# Patient Record
Sex: Female | Born: 1935 | Race: White | Hispanic: No | Smoking: Never smoker
Health system: Southern US, Community
[De-identification: ages and names within clinical notes are randomized; demographics above are authoritative.]

## PROBLEM LIST (undated history)

## (undated) DIAGNOSIS — H9190 Unspecified hearing loss, unspecified ear: Secondary | ICD-10-CM

## (undated) DIAGNOSIS — I10 Essential (primary) hypertension: Secondary | ICD-10-CM

## (undated) HISTORY — PX: JOINT REPLACEMENT: SHX530

---

## 2019-05-26 ENCOUNTER — Other Ambulatory Visit: Payer: Self-pay

## 2019-05-26 ENCOUNTER — Emergency Department (HOSPITAL_BASED_OUTPATIENT_CLINIC_OR_DEPARTMENT_OTHER): Payer: Medicare Other

## 2019-05-26 ENCOUNTER — Encounter (HOSPITAL_BASED_OUTPATIENT_CLINIC_OR_DEPARTMENT_OTHER): Payer: Self-pay | Admitting: *Deleted

## 2019-05-26 ENCOUNTER — Emergency Department (HOSPITAL_BASED_OUTPATIENT_CLINIC_OR_DEPARTMENT_OTHER)
Admission: EM | Admit: 2019-05-26 | Discharge: 2019-05-26 | Disposition: A | Discharge status: Home / Self Care | Payer: Medicare Other | Attending: Emergency Medicine | Admitting: Emergency Medicine

## 2019-05-26 DIAGNOSIS — W01198A Fall on same level from slipping, tripping and stumbling with subsequent striking against other object, initial encounter: Secondary | ICD-10-CM | POA: Insufficient documentation

## 2019-05-26 DIAGNOSIS — Y9389 Activity, other specified: Secondary | ICD-10-CM | POA: Diagnosis not present

## 2019-05-26 DIAGNOSIS — S51812A Laceration without foreign body of left forearm, initial encounter: Secondary | ICD-10-CM | POA: Diagnosis not present

## 2019-05-26 DIAGNOSIS — S0990XA Unspecified injury of head, initial encounter: Secondary | ICD-10-CM | POA: Insufficient documentation

## 2019-05-26 DIAGNOSIS — Y998 Other external cause status: Secondary | ICD-10-CM | POA: Diagnosis not present

## 2019-05-26 DIAGNOSIS — Y929 Unspecified place or not applicable: Secondary | ICD-10-CM | POA: Insufficient documentation

## 2019-05-26 DIAGNOSIS — I10 Essential (primary) hypertension: Secondary | ICD-10-CM | POA: Diagnosis not present

## 2019-05-26 DIAGNOSIS — S59912A Unspecified injury of left forearm, initial encounter: Secondary | ICD-10-CM | POA: Diagnosis present

## 2019-05-26 HISTORY — DX: Essential (primary) hypertension: I10

## 2019-05-26 NOTE — ED Notes (Signed)
Steri strips applied to left elbow skin tear, non stick top dsg applied and kling

## 2019-05-26 NOTE — ED Provider Notes (Signed)
[facility] EMERGENCY DEPARTMENT Provider Note   CSN: [id number] Arrival date & time: 05/26/19  1854     History Chief Complaint  Patient presents with  . Fall    Caitlyn Braun is a 86 y.o. female.  86 yo F with a chief complaints of a fall.  Patient states that she was reaching for something and lost her balance.  Ended up striking the side of her head on a counter and then her arm on the same counter.  She denies other area of injury denies headache denies neck pain denies chest pain denies abdominal pain.  Has been able to ambulate afterwards without issue.  The history is provided by the patient and a relative.  Fall This is a recurrent problem. The current episode started 3 to 5 hours ago. The problem occurs rarely. The problem has been resolved. Pertinent negatives include no chest pain, no headaches and no shortness of breath. Nothing aggravates the symptoms. Nothing relieves the symptoms.       Past Medical History:  Diagnosis Date  . Hypertension     There are no problems to display for this patient.   Past Surgical History:  Procedure Laterality Date  . JOINT REPLACEMENT       OB History   No obstetric history on file.     History reviewed. No pertinent family history.  Social History   Tobacco Use  . Smoking status: Never Smoker  . Smokeless tobacco: Never Used  Substance Use Topics  . Alcohol use: Never  . Drug use: Never    Home Medications Prior to Admission medications   Medication Sig Start Date End Date Taking? Authorizing Provider  lisinopril (ZESTRIL) 40 MG tablet Take by mouth.    [provider]    Allergies    Patient has no known allergies.  Review of Systems   Review of Systems  Constitutional: Negative for chills and fever.  HENT: Negative for congestion and rhinorrhea.   Eyes: Negative for redness and visual disturbance.  Respiratory: Negative for shortness of breath and wheezing.   Cardiovascular:  Negative for chest pain and palpitations.  Gastrointestinal: Negative for nausea and vomiting.  Genitourinary: Negative for dysuria and urgency.  Musculoskeletal: Negative for arthralgias and myalgias.  Skin: Positive for wound. Negative for pallor.  Neurological: Negative for dizziness and headaches.    Physical Exam Updated Vital Signs BP (!) 200/84 (BP Location: Right Arm)   Pulse 72   Temp 98.2 F (36.8 C) (Oral)   Resp 18   Ht 5' 3.5" (1.613 m)   Wt 56.7 kg   SpO2 98%   BMI 21.80 kg/m   Physical Exam Vitals and nursing note reviewed.  Constitutional:      General: She is not in acute distress.    Appearance: She is well-developed. She is not diaphoretic.  HENT:     Head: Normocephalic and atraumatic.  Eyes:     Pupils: Pupils are equal, round, and reactive to light.  Cardiovascular:     Rate and Rhythm: Normal rate and regular rhythm.     Heart sounds: No murmur. No friction rub. No gallop.   Pulmonary:     Effort: Pulmonary effort is normal.     Breath sounds: No wheezing or rales.  Abdominal:     General: There is no distension.     Palpations: Abdomen is soft.     Tenderness: There is no abdominal tenderness.  Musculoskeletal:  General: No tenderness.     Cervical back: Normal range of motion and neck supple.     Comments: Palpated from head to toe without obvious noted areas of pain.  Skin:    General: Skin is warm and dry.     Comments: Approximately 5 cm skin tear to the left lateral forearm.  No surrounding bony tenderness.  Neurological:     Mental Status: She is alert and oriented to person, place, and time.  Psychiatric:        Behavior: Behavior normal.     ED Results / Procedures / Treatments   Labs (all labs ordered are listed, but only abnormal results are displayed) Labs Reviewed - No data to display  EKG None  Radiology CT HEAD WO CONTRAST  Result Date: 05/26/2019 CLINICAL DATA:  Witnessed fall, hit left side of head EXAM: CT  HEAD WITHOUT CONTRAST TECHNIQUE: Contiguous axial images were obtained from the base of the skull through the vertex without intravenous contrast. COMPARISON:  None. FINDINGS: Brain: No acute infarct or hemorrhage. Lateral ventricles and midline structures are unremarkable. Scattered hypodensities within the periventricular white matter consistent with chronic small vessel ischemic changes. No acute extra-axial fluid collections. No mass effect. Vascular: No hyperdense vessel or unexpected calcification. Skull: Normal. Negative for fracture or focal lesion. Sinuses/Orbits: No acute finding. Other: None. IMPRESSION: 1. No acute intracranial process. Electronically Signed   By: Sharlet Salina M.D.   On: 05/26/2019 19:59   CT Cervical Spine Wo Contrast  Result Date: 05/26/2019 CLINICAL DATA:  Head trauma EXAM: CT CERVICAL SPINE WITHOUT CONTRAST TECHNIQUE: Multidetector CT imaging of the cervical spine was performed without intravenous contrast. Multiplanar CT image reconstructions were also generated. COMPARISON:  None FINDINGS: Alignment: Straightening of the cervical spine. Trace anterolisthesis C3 on C4 and C4 on C5. Facet alignment is maintained. Skull base and vertebrae: No acute fracture. No primary bone lesion or focal pathologic process. Soft tissues and spinal canal: No prevertebral fluid or swelling. No visible canal hematoma. Disc levels: Multiple level degenerative change, most advanced and moderate at C5-C6. Facet degenerative change at multiple levels. Upper chest: Suspected prior right carotid intervention. Lung apices are clear Other: None IMPRESSION: Straightening with trace anterior listhesis C3 on C4 and C4 on C5. No fracture is seen Electronically Signed   By: Jasmine Pang M.D.   On: 05/26/2019 20:03    Procedures Procedures (including critical care time)  Medications Ordered in ED Medications - No data to display  ED Course  I have reviewed the triage vital signs and the nursing  notes.  Pertinent labs & imaging results that were available during my care of the patient were reviewed by me and considered in my medical decision making (see chart for details).    MDM Rules/Calculators/A&P                      86 yo F with a chief complaints of a fall.  Nonsyncopal by history.  Complaining only of a skin tear and that her head brushed up against a counter.  Will obtain a CT of the head and C-spine.  Repaired the wound at bedside.  Discharged home.  CT scan negative.  8:34 PM:  I have discussed the diagnosis/risks/treatment options with the patient and family and believe the pt to be eligible for discharge home to follow-up with PCP. We also discussed returning to the ED immediately if new or worsening sx occur. We discussed the sx which  are most concerning (e.g., sudden worsening pain, fever, inability to tolerate by mouth) that necessitate immediate return. Medications administered to the patient during their visit and any new prescriptions provided to the patient are listed below.  Medications given during this visit Medications - No data to display   The patient appears reasonably screen and/or stabilized for discharge and I doubt any other medical condition or other [facility] requiring further screening, evaluation, or treatment in the ED at this time prior to discharge.   Final Clinical Impression(s) / ED Diagnoses Final diagnoses:  Skin tear of forearm without complication, left, initial encounter    Rx / DC Orders ED Discharge Orders    None       Deno Etienne, DO 05/26/19 2034

## 2019-05-26 NOTE — ED Notes (Signed)
Patient transported to X-rayPatient transported to CT

## 2019-05-26 NOTE — ED Triage Notes (Signed)
Pt from [facility]. States that she reached and had a witnessed fall. Pt did hit left side of head, no bruising, swelling noted. Pt denies LOC.  Skin tear with bandage to left forearm. Pt A/O x 4.

## 2019-09-02 ENCOUNTER — Emergency Department (HOSPITAL_COMMUNITY): Payer: Medicare Other

## 2019-09-02 ENCOUNTER — Emergency Department (HOSPITAL_COMMUNITY)
Admission: EM | Admit: 2019-09-02 | Discharge: 2019-09-02 | Disposition: A | Discharge status: Other Acute Inpatient Hospital | Payer: Medicare Other | Attending: Emergency Medicine | Admitting: Emergency Medicine

## 2019-09-02 DIAGNOSIS — Z20822 Contact with and (suspected) exposure to covid-19: Secondary | ICD-10-CM | POA: Insufficient documentation

## 2019-09-02 DIAGNOSIS — R531 Weakness: Secondary | ICD-10-CM

## 2019-09-02 DIAGNOSIS — Z79899 Other long term (current) drug therapy: Secondary | ICD-10-CM | POA: Insufficient documentation

## 2019-09-02 DIAGNOSIS — J069 Acute upper respiratory infection, unspecified: Secondary | ICD-10-CM

## 2019-09-02 DIAGNOSIS — J029 Acute pharyngitis, unspecified: Secondary | ICD-10-CM | POA: Insufficient documentation

## 2019-09-02 DIAGNOSIS — I10 Essential (primary) hypertension: Secondary | ICD-10-CM | POA: Insufficient documentation

## 2019-09-02 DIAGNOSIS — R52 Pain, unspecified: Secondary | ICD-10-CM

## 2019-09-02 LAB — CBC WITH DIFFERENTIAL/PLATELET
Abs Immature Granulocytes: 0.12 10*3/uL — ABNORMAL HIGH (ref 0.00–0.07)
Basophils Absolute: 0 10*3/uL (ref 0.0–0.1)
Basophils Relative: 0 %
Eosinophils Absolute: 0.1 10*3/uL (ref 0.0–0.5)
Eosinophils Relative: 0 %
HCT: 39.1 % (ref 36.0–46.0)
Hemoglobin: 12.3 g/dL (ref 12.0–15.0)
Immature Granulocytes: 1 %
Lymphocytes Relative: 10 %
Lymphs Abs: 1.5 10*3/uL (ref 0.7–4.0)
MCH: 29.2 pg (ref 26.0–34.0)
MCHC: 31.5 g/dL (ref 30.0–36.0)
MCV: 92.9 fL (ref 80.0–100.0)
Monocytes Absolute: 0.9 10*3/uL (ref 0.1–1.0)
Monocytes Relative: 6 %
Neutro Abs: 12.1 10*3/uL — ABNORMAL HIGH (ref 1.7–7.7)
Neutrophils Relative %: 83 %
Platelets: 451 10*3/uL — ABNORMAL HIGH (ref 150–400)
RBC: 4.21 MIL/uL (ref 3.87–5.11)
RDW: 15.3 % (ref 11.5–15.5)
WBC: 14.7 10*3/uL — ABNORMAL HIGH (ref 4.0–10.5)
nRBC: 0 % (ref 0.0–0.2)

## 2019-09-02 LAB — BASIC METABOLIC PANEL
Anion gap: 13 (ref 5–15)
BUN: 27 mg/dL — ABNORMAL HIGH (ref 8–23)
CO2: 23 mmol/L (ref 22–32)
Calcium: 9.4 mg/dL (ref 8.9–10.3)
Chloride: 102 mmol/L (ref 98–111)
Creatinine, Ser: 0.99 mg/dL (ref 0.44–1.00)
GFR calc Af Amer: 56 mL/min — ABNORMAL LOW (ref >60)
GFR calc non Af Amer: 48 mL/min — ABNORMAL LOW (ref >60)
Glucose, Bld: 141 mg/dL — ABNORMAL HIGH (ref 70–99)
Potassium: 4.3 mmol/L (ref 3.5–5.1)
Sodium: 138 mmol/L (ref 135–145)

## 2019-09-02 LAB — URINALYSIS, ROUTINE W REFLEX MICROSCOPIC
Bacteria, UA: NONE SEEN
Bilirubin Urine: NEGATIVE
Glucose, UA: NEGATIVE mg/dL
Ketones, ur: NEGATIVE mg/dL
Leukocytes,Ua: NEGATIVE
Nitrite: NEGATIVE
Protein, ur: >=300 mg/dL — AB
Specific Gravity, Urine: 1.026 (ref 1.005–1.030)
pH: 5 (ref 5.0–8.0)

## 2019-09-02 LAB — CBG MONITORING, ED: Glucose-Capillary: 144 mg/dL — ABNORMAL HIGH (ref 70–99)

## 2019-09-02 LAB — GROUP A STREP BY PCR: Group A Strep by PCR: NOT DETECTED

## 2019-09-02 LAB — TROPONIN I (HIGH SENSITIVITY)
Troponin I (High Sensitivity): 34 ng/L — ABNORMAL HIGH (ref <18)
Troponin I (High Sensitivity): 19 ng/L — ABNORMAL HIGH (ref <18)
Troponin I (High Sensitivity): 44 ng/L — ABNORMAL HIGH (ref <18)

## 2019-09-02 LAB — SARS CORONAVIRUS 2 BY RT PCR (HOSPITAL ORDER, PERFORMED IN ~~LOC~~ HOSPITAL LAB): SARS Coronavirus 2: NEGATIVE

## 2019-09-02 MED ORDER — AZITHROMYCIN 250 MG PO TABS
ORAL_TABLET | ORAL | 0 refills | Status: DC
Start: 2019-09-02 — End: 2021-05-24

## 2019-09-02 MED ORDER — AZITHROMYCIN 250 MG PO TABS
ORAL_TABLET | ORAL | 0 refills | Status: DC
Start: 2019-09-02 — End: 2019-09-02

## 2019-09-02 MED ORDER — LISINOPRIL 20 MG PO TABS
40.0000 mg | ORAL_TABLET | Freq: Once | ORAL | Status: AC
  Administered 2019-09-02: 40 mg via ORAL

## 2019-09-02 NOTE — Evaluation (Signed)
Physical Therapy Evaluation Patient Details Name: Caitlyn Braun MRN: [id number] DOB: 01-Jan-1923 Today's Date: 09/02/2019   History of Present Illness  86 y.o. presentng from home with weakness and fatigue found leaning over on the toilet. Pt complains of sore throat, also has chronic RLE pain.  Clinical Impression  Pt presents to PT with significant weakness as well as deficits in functional mobility, gait, balance, power, endurance, safety awareness, and cognition. Pt requires max-totalA for all functional mobility due to weakness. Pt has preference to lean onto left elbow with sitting activity and is unable to obtain an upright position with verbal cues or PT physical assistance. PT defers standing or transfer attempts due to safety risk as pt begins to ignore PT cues for technique. Pt will benefit from continued acute PT POC to improve mobility quality and to reduce falls risk. PT recommends SNF placement at the time of discharge as the pt is at a great risk of falling and needs physical assistance to perform all mobility tasks at this time.    Follow Up Recommendations SNF;Supervision/Assistance - 24 hour (assistance for all functional mobility)    Equipment Recommendations  Wheelchair (measurements PT);Wheelchair cushion (measurements PT);Hospital bed (mechanical lift)    Recommendations for Other Services       Precautions / Restrictions Precautions Precautions: Fall Restrictions Weight Bearing Restrictions: No      Mobility  Bed Mobility Overal bed mobility: Needs Assistance Bed Mobility: Supine to Sit;Sit to Supine     Supine to sit: Max assist Sit to supine: Total assist      Transfers Overall transfer level: Needs assistance               General transfer comment: attempted to initiate stand however pt unable to come to upright sitting position. Pt with poor initiation through UE to pull with PT assist into standing. Due to difficulty following PT commands  for technique PT defers stand attempt due to high falls risk  Ambulation/Gait                Stairs            Wheelchair Mobility    Modified Rankin (Stroke Patients Only)       Balance Overall balance assessment: Needs assistance Sitting-balance support: Feet unsupported;Single extremity supported Sitting balance-Leahy Scale: Poor Sitting balance - Comments: modA with significant left lateral trunk lean onto left elbow. Pt is unable to sit upright during session despite PT assistance, always returning to left elbow for support Postural control: Left lateral lean                                   Pertinent Vitals/Pain Pain Assessment: Faces Faces Pain Scale: Hurts little more Pain Location: R knee with mobility Pain Descriptors / Indicators: Grimacing Pain Intervention(s): Monitored during session    Home Living Family/patient expects to be discharged to:: Assisted living               Home Equipment: Walker - 2 wheels      Prior Function Level of Independence: Independent with assistive device(s);Needs assistance   Gait / Transfers Assistance Needed: pt reports ambulating modI with use of Rollator  ADL's / Homemaking Assistance Needed: staff assist with IADLs, performs ADLs independently        Hand Dominance        Extremity/Trunk Assessment   Upper Extremity Assessment Upper Extremity Assessment: Generalized weakness  Lower Extremity Assessment Lower Extremity Assessment: Generalized weakness;RLE deficits/detail RLE Deficits / Details: RLE with more significant weakness, 3-/5 knee extension, 3/5 PF/DF and hip flexion    Cervical / Trunk Assessment Cervical / Trunk Assessment: Kyphotic;Other exceptions (pt appears to have scoliosis with a right curvature)  Communication   Communication: No difficulties  Cognition Arousal/Alertness: Awake/alert Behavior During Therapy: WFL for tasks assessed/performed Overall  Cognitive Status: No family/caregiver present to determine baseline cognitive functioning                                 General Comments: pt is oriented to person, place, situation. Pt with awareness of weakness but does not seem to understand how this will impair her from returning to walking at her ALF although she was unable to stand with PT at this time.      General Comments General comments (skin integrity, edema, etc.): VSS on RA    Exercises     Assessment/Plan    PT Assessment Patient needs continued PT services  PT Problem List Decreased strength;Decreased range of motion;Decreased activity tolerance;Decreased balance;Decreased mobility;Decreased cognition;Decreased knowledge of use of DME;Decreased safety awareness;Decreased knowledge of precautions;Pain       PT Treatment Interventions DME instruction;Gait training;Functional mobility training;Therapeutic activities;Therapeutic exercise;Balance training;Neuromuscular re-education;Cognitive remediation;Patient/family education;Wheelchair mobility training    PT Goals (Current goals can be found in the Care Plan section)  Acute Rehab PT Goals Patient Stated Goal: To go home PT Goal Formulation: With patient Time For Goal Achievement: 09/16/19 Potential to Achieve Goals: Fair    Frequency Min 2X/week   Barriers to discharge Decreased caregiver support      Co-evaluation               AM-PAC PT "6 Clicks" Mobility  Outcome Measure Help needed turning from your back to your side while in a flat bed without using bedrails?: Total Help needed moving from lying on your back to sitting on the side of a flat bed without using bedrails?: Total Help needed moving to and from a bed to a chair (including a wheelchair)?: Total Help needed standing up from a chair using your arms (e.g., wheelchair or bedside chair)?: Total Help needed to walk in hospital room?: Total Help needed climbing 3-5 steps with a  railing? : Total 6 Click Score: 6    End of Session   Activity Tolerance: Patient limited by fatigue Patient left: in bed Nurse Communication: Mobility status;Need for lift equipment PT Visit Diagnosis: Other abnormalities of gait and mobility (R26.89);Muscle weakness (generalized) (M62.81);Difficulty in walking, not elsewhere classified (R26.2)    Time: 6712-4580 PT Time Calculation (min) (ACUTE ONLY): 36 min   Charges:   PT Evaluation $PT Eval Moderate Complexity: 1 Mod          Arlyss Gandy, PT, DPT Acute Rehabilitation Pager: [phone]   Arlyss Gandy 09/02/2019, 4:07 PM

## 2019-09-02 NOTE — Consult Note (Signed)
Date: 09/02/2019               Patient Name:  Caitlyn Braun MRN: [id number]  DOB: 1922/08/23 Age / Sex: 86 y.o., female   PCP: Patient, No Pcp Per         Requesting Physician: Dr. Myrtis Ser Stevphen Meuse, MD    Consulting Reason:  Weakness     Chief Complaint: Weakness  History of Present Illness: Caitlyn Braun is a 86 yo woman with a PMH of HTN who presented to the ED by [facility] from [facility] for an evaluation of weakness. Per EMS, patient was found slumped over in her bathroom at the facility. She was found on the toilet during an incontinent episode and having difficulty standing. The staff called EMS because they were worried about a possible stroke. The patient LKW was 2000 and she is usually ambulatory with a walker.  Patient was evaluated at the bedside. She initially had no recollection of why she was in the ED. But when probed some more, she states she had difficult getting up from the toilet. She usually ambulates with a walker and she takes longer (~30 min sometimes) to get up when she sits down. She denies any recent falls or trauma. States she has had a a chronic right leg pain (points to R thigh) that has been limiting her mobility. She usually does exercises while in her bed to increase her strength. She endorse a recent cold with associated sore throat and productive cough. The phlegm is like "gravy" and "yellow". She has tried Stage manager for it. She endorse some arthritis pains in her fingers but denies any fever, chills, nasal drainage, CP, SOB, abdominal pain, back pain, dizziness, headache, numbness, tingling, leg swelling, N/V, dysuria, diarrhea or constipation.    Meds: No current facility-administered medications for this encounter.   Current Outpatient Medications  Medication Sig Dispense Refill  . guaiFENesin (MUCINEX) 600 MG 12 hr tablet Take 600 mg by mouth 2 (two) times daily.    Marland Kitchen lisinopril (ZESTRIL) 40 MG tablet Take 40 mg by mouth daily.      Marland Kitchen azithromycin (ZITHROMAX) 250 MG tablet Take 2 tablets on the first day, followed by 1 tablet the following 4 days. 6 tablet 0    Allergies: Allergies as of 09/02/2019  . (No Known Allergies)   Past Medical History:  Diagnosis Date  . Hypertension    Past Surgical History:  Procedure Laterality Date  . JOINT REPLACEMENT     No family history on file. Social History   Socioeconomic History  . Marital status: Widowed    Spouse name: Not on file  . Number of children: Not on file  . Years of education: Not on file  . Highest education level: Not on file  Occupational History  . Not on file  Tobacco Use  . Smoking status: Never Smoker  . Smokeless tobacco: Never Used  Substance and Sexual Activity  . Alcohol use: Never  . Drug use: Never  . Sexual activity: Not on file  Other Topics Concern  . Not on file  Social History Narrative  . Not on file   Social Determinants of Health   Financial Resource Strain:   . Difficulty of Paying Living Expenses:   Food Insecurity:   . Worried About Programme researcher, broadcasting/film/video in the Last Year:   . Barista in the Last Year:   Transportation Needs:   . Freight forwarder (Medical):   Marland Kitchen  Lack of Transportation (Non-Medical):   Physical Activity:   . Days of Exercise per Week:   . Minutes of Exercise per Session:   Stress:   . Feeling of Stress :   Social Connections:   . Frequency of Communication with Friends and Family:   . Frequency of Social Gatherings with Friends and Family:   . Attends Religious Services:   . Active Member of Clubs or Organizations:   . Attends Banker Meetings:   Marland Kitchen Marital Status:   Intimate Partner Violence:   . Fear of Current or Ex-Partner:   . Emotionally Abused:   Marland Kitchen Physically Abused:   . Sexually Abused:     Review of Systems: Pertinent items noted in HPI and remainder of comprehensive ROS otherwise negative.  Physical Exam: Blood pressure (!) 186/77, pulse 90,  temperature 97.8 F (36.6 C), temperature source Oral, resp. rate 18, height 5\' 3"  (1.6 m), weight 56.7 kg, SpO2 97 %.  General: Pleasant elderly woman laying in bed with hearing difficulties. No acute distress. Head: Normocephalic/Atraumatic HEENT: MMM. PERRLA. Tongue midline. Neck: Supple. Normal ROM. CV: RRR. No m/r/g. No LE edema Respiratory: Lungs CTAB. No wheezes. No increased WOB.  Abdomen: Soft, non-tender, non-distended. Normal bowel sounds. Skin: No rashes or lesions. Multiple small cherry hemangiomas on abdomen. Decreased skin turgor. Extremities: Normal ROM. Mild tenderness to palpation of the R thigh. Evidence of R knee effusion 2/2 to arthritic changes. No signs of trauma. Neuro: Oriented to person and place but not to time. Moves all extremities. No facial droop or slurred speech. Strength 5/5 RUE, LUE and LLE. 4/5 on LLE 2/2 to pain. Normal sensation  Lab results: CBC Latest Ref Rng & Units 09/02/2019  WBC 4.0 - 10.5 K/uL 14.7(H)  Hemoglobin 12.0 - 15.0 g/dL 09/04/2019  Hematocrit 36 - 46 % 39.1  Platelets 150 - 400 K/uL 451(H)   BMP Latest Ref Rng & Units 09/02/2019  Glucose 70 - 99 mg/dL 09/04/2019)  BUN 8 - 23 mg/dL 539(J)  Creatinine 67(H - 1.00 mg/dL 4.19  Sodium 3.79 - 024 mmol/L 138  Potassium 3.5 - 5.1 mmol/L 4.3  Chloride 98 - 111 mmol/L 102  CO2 22 - 32 mmol/L 23  Calcium 8.9 - 10.3 mg/dL 9.4   UA negative for leukocytes, WBC and nitrites. Positive for large hgb and proteins.   Imaging results:  DG Chest 2 View  Result Date: 09/02/2019 CLINICAL DATA:  Generalized weakness EXAM: CHEST - 2 VIEW COMPARISON:  None. FINDINGS: Cardiac shadow is within normal limits. Aortic calcifications are noted. The lungs are well aerated without focal infiltrate or sizable effusion. No acute bony abnormality is seen. IMPRESSION: No acute abnormality noted. Electronically Signed   By: 09/04/2019 M.D.   On: 09/02/2019 10:59   CT Head Wo Contrast  Result Date: 09/02/2019 CLINICAL  DATA:  Generalized weakness. EXAM: CT HEAD WITHOUT CONTRAST TECHNIQUE: Contiguous axial images were obtained from the base of the skull through the vertex without intravenous contrast. COMPARISON:  Head CT dated 05/26/2019. FINDINGS: Brain: No evidence of acute infarction, hemorrhage, hydrocephalus, extra-axial collection or mass lesion/mass effect. There is mild cerebral volume loss with associated ex vacuo dilatation. Periventricular white matter hypoattenuation likely represents chronic small vessel ischemic disease. Vascular: There are vascular calcifications in the carotid siphons. Skull: Normal. Negative for fracture or focal lesion. Sinuses/Orbits: There is sinus disease of the bilateral maxillary and ethmoid sinuses. Other: None. IMPRESSION: No acute intracranial process. Electronically Signed   By: 07/26/2019  [place] M.D.   On: 09/02/2019 10:34   DG Knee Complete 4 Views Right  Result Date: 09/02/2019 CLINICAL DATA:  Patient found slumped over in bathroom. EXAM: RIGHT KNEE - COMPLETE 4+ VIEW COMPARISON:  None. FINDINGS: Diffuse decreased bone mineralization. Mild-to-moderate tricompartmental osteoarthritic change. No acute fracture or dislocation. IMPRESSION: No acute findings. Electronically Signed   By: Elberta Fortis M.D.   On: 09/02/2019 14:14    Other results: EKG: Sinus rhythm with premature atrial complexes. No ST changes or evidence of ischemic changes. There are no previous tracings available for comparison.  Assessment, Plan, & Recommendations by Problem: Principal Problem:   URI (upper respiratory infection) Active Problems:   Weakness  1) Weakness Patient transferred to ED for stroke evaluation due to witnessed generalized weakness at a facility. Stroke work up negative w/ CT head showing no acute intracranial process. Neuro exam relatively normal with the only significant finding being 4/5 strength in RLE 2/2 pain. CXR and EKG unremarkable. UA negative for an infectious process but  shows mild dehydration. X-ray R knee does not show any fracture or dislocation. On assessment, patient likely weak due to mild dehydration and chronic right lower extremity pain. PT evaluated patient and recommended 24 hour supervision/assistance and wheelchair. Currently no indication for admission. Would benefit from rehab at her assisted living facility.   Recs: Patient will benefit from ongoing skilled PT services in her Assisted Living facility to continue to advance safe functional mobility, address ongoing impairments and minimize fall risk. Maintain hydration.   2) Upper Respiratory Infection Patient reported a recent cold w/ sore throat and a productive cough. Denies any headaches, fever, chills or nasal drainage. Treated with saltwater gargles. Afebrile on arrival but with WBC of 14.7. Negative for covid19 and group A strep. Likely an upper respiratory infection that is improving. Would recommend Azithromycin 250 mg  Recs: Azithromycin 250 mg, take 2 tablets first day, followed by 1 tablet for the next 4 days. Follow up with PCP  Signed: Steffanie Rainwater, MD 09/02/2019, 5:32 PM  [phone] Internal Medicine Teaching Service

## 2019-09-02 NOTE — ED Provider Notes (Addendum)
[facility] EMERGENCY DEPARTMENT Provider Note   CSN: [id number] Arrival date & time: 09/02/19  6629     History No chief complaint on file.   Caitlyn Braun is a 86 y.o. female.   Weakness Severity:  Moderate Onset quality:  Gradual Timing:  Constant Progression:  Unchanged Chronicity:  New Context comment:  Unclear, reported by facility, pt only complaint is sore throat Relieved by:  Nothing Worsened by:  Nothing Ineffective treatments:  None tried Associated symptoms: difficulty walking (reported)   Associated symptoms: no abdominal pain, no arthralgias, no chest pain, no cough, no diarrhea, no dysuria, no fever, no foul-smelling urine, no headaches, no loss of consciousness, no nausea, no sensory-motor deficit, no shortness of breath and no vomiting        Past Medical History:  Diagnosis Date  . Hypertension     There are no problems to display for this patient.   Past Surgical History:  Procedure Laterality Date  . JOINT REPLACEMENT       OB History   No obstetric history on file.     No family history on file.  Social History   Tobacco Use  . Smoking status: Never Smoker  . Smokeless tobacco: Never Used  Substance Use Topics  . Alcohol use: Never  . Drug use: Never    Home Medications Prior to Admission medications   Medication Sig Start Date End Date Taking? Authorizing Provider  guaiFENesin (MUCINEX) 600 MG 12 hr tablet Take 600 mg by mouth 2 (two) times daily.   Yes [provider]  lisinopril (ZESTRIL) 40 MG tablet Take 40 mg by mouth daily.    Yes [provider]    Allergies    Patient has no known allergies.  Review of Systems   Review of Systems  Constitutional: Negative for chills and fever.  HENT: Positive for sore throat. Negative for congestion and rhinorrhea.   Respiratory: Negative for cough and shortness of breath.   Cardiovascular: Negative for chest pain and palpitations.    Gastrointestinal: Negative for abdominal pain, diarrhea, nausea and vomiting.  Genitourinary: Negative for difficulty urinating and dysuria.  Musculoskeletal: Negative for arthralgias and back pain.  Skin: Negative for rash and wound.  Neurological: Positive for weakness. Negative for loss of consciousness, light-headedness and headaches.    Physical Exam Updated Vital Signs BP (!) 168/78   Pulse 72   Temp 97.8 F (36.6 C) (Oral)   Resp (!) 27   Ht 5\' 3"  (1.6 m)   Wt 56.7 kg   SpO2 96%   BMI 22.14 kg/m   Physical Exam Vitals and nursing note reviewed. Exam conducted with a chaperone present.  Constitutional:      General: She is not in acute distress.    Appearance: Normal appearance.  HENT:     Head: Normocephalic and atraumatic.     Nose: No rhinorrhea.     Mouth/Throat:     Mouth: Mucous membranes are moist.     Pharynx: Posterior oropharyngeal erythema (mild) present. No oropharyngeal exudate.     Comments: Midline uvula  Eyes:     General:        Right eye: No discharge.        Left eye: No discharge.     Conjunctiva/sclera: Conjunctivae normal.  Cardiovascular:     Rate and Rhythm: Normal rate and regular rhythm.  Pulmonary:     Effort: Pulmonary effort is normal. No respiratory distress.     Breath  sounds: No stridor.  Abdominal:     General: Abdomen is flat. There is no distension.     Palpations: Abdomen is soft.  Musculoskeletal:        General: No tenderness or signs of injury.  Skin:    General: Skin is warm and dry.  Neurological:     General: No focal deficit present.     Mental Status: She is alert. Mental status is at baseline.     Motor: No weakness.     Comments: 5-5 strength in the lower extremities, knee flexion limited by pain in the right.  Sensation intact, 5 out of 5 strength in the upper extremities sensation intact, no facial droop no slurred speech, equal reactive pupils.  Did not ambulate patient  Psychiatric:        Mood and  Affect: Mood normal.        Behavior: Behavior normal.     ED Results / Procedures / Treatments   Labs (all labs ordered are listed, but only abnormal results are displayed) Labs Reviewed  CBC WITH DIFFERENTIAL/PLATELET - Abnormal; Notable for the following components:      Result Value   WBC 14.7 (*)    Platelets 451 (*)    Neutro Abs 12.1 (*)    Abs Immature Granulocytes 0.12 (*)    All other components within normal limits  BASIC METABOLIC PANEL - Abnormal; Notable for the following components:   Glucose, Bld 141 (*)    BUN 27 (*)    GFR calc non Af Amer 48 (*)    GFR calc Af Amer 56 (*)    All other components within normal limits  URINALYSIS, ROUTINE W REFLEX MICROSCOPIC - Abnormal; Notable for the following components:   Color, Urine AMBER (*)    APPearance HAZY (*)    Hgb urine dipstick LARGE (*)    Protein, ur >=300 (*)    All other components within normal limits  CBG MONITORING, ED - Abnormal; Notable for the following components:   Glucose-Capillary 144 (*)    All other components within normal limits  TROPONIN I (HIGH SENSITIVITY) - Abnormal; Notable for the following components:   Troponin I (High Sensitivity) 34 (*)    All other components within normal limits  TROPONIN I (HIGH SENSITIVITY) - Abnormal; Notable for the following components:   Troponin I (High Sensitivity) 19 (*)    All other components within normal limits  GROUP A STREP BY PCR  SARS CORONAVIRUS 2 BY RT PCR (HOSPITAL ORDER, PERFORMED IN [facility])  TROPONIN I (HIGH SENSITIVITY)    EKG None  Radiology DG Chest 2 View  Result Date: 09/02/2019 CLINICAL DATA:  Generalized weakness EXAM: CHEST - 2 VIEW COMPARISON:  None. FINDINGS: Cardiac shadow is within normal limits. Aortic calcifications are noted. The lungs are well aerated without focal infiltrate or sizable effusion. No acute bony abnormality is seen. IMPRESSION: No acute abnormality noted. Electronically Signed   By: Alcide Clever M.D.   On: 09/02/2019 10:59   CT Head Wo Contrast  Result Date: 09/02/2019 CLINICAL DATA:  Generalized weakness. EXAM: CT HEAD WITHOUT CONTRAST TECHNIQUE: Contiguous axial images were obtained from the base of the skull through the vertex without intravenous contrast. COMPARISON:  Head CT dated 05/26/2019. FINDINGS: Brain: No evidence of acute infarction, hemorrhage, hydrocephalus, extra-axial collection or mass lesion/mass effect. There is mild cerebral volume loss with associated ex vacuo dilatation. Periventricular white matter hypoattenuation likely represents chronic small vessel ischemic  disease. Vascular: There are vascular calcifications in the carotid siphons. Skull: Normal. Negative for fracture or focal lesion. Sinuses/Orbits: There is sinus disease of the bilateral maxillary and ethmoid sinuses. Other: None. IMPRESSION: No acute intracranial process. Electronically Signed   By: Romona Curls M.D.   On: 09/02/2019 10:34    Procedures Procedures (including critical care time)  Medications Ordered in ED Medications - No data to display  ED Course  I have reviewed the triage vital signs and the nursing notes.  Pertinent labs & imaging results that were available during my care of the patient were reviewed by me and considered in my medical decision making (see chart for details).    MDM Rules/Calculators/A&P                          Reports to [place] via EMS for weakness fatigue, found sitting on the toilet leaning to the left.  Last known without weakness fatigue last night when she went to bed.  Vital signs are stable she is afebrile she is able to answer questions appropriately, she says her main complaint is sore throat.  She has pain in her right leg which she says is chronic, however she has normal motor function and sensation no facial droop, no cranial nerve deficits.  Equal strength in the upper extremities.  The spoke to the facility and the son, their concern for change  in her wellbeing.  Possible strokelike symptoms reported difficulty speaking earlier however there appears to be limitation due to hearing issues.  So far leukocytosis is seen.  CT scan shows no acute intracranial abnormality after reviewed by radiology, I reviewed these images as well.  Chest x-ray is the same no acute findings.  I reviewed this images as well.  Laboratory studies show no significant kidney dysfunction or derangement in electrolytes.  Still waiting for urinalysis.  Troponin is elevated however EKG shows sinus rhythm with acute no acute ischemic change, there are premature atrial complexes followed by QRS complexes however no ischemic changes.  She also denies chest pain or shortness of breath.  Patient's laboratory studies are unremarkable for any acute abnormalities other than the troponin that was elevated.  Urinalysis shows some mild dehydration possibly strep throat is negative, Covid is negative.  Urinalysis shows no signs of infection.  She has a mild leukocytosis with no source at this time will recommend admission for further work-up.  Her vital signs fortunately are stable so no need for antibiotics at this time as there is no fevers or significant vital sign changes.  Possible stroke still on the list however last known normal was 8 PM last night when she went to bed so be well out of the window for TPA however possible MRI as needed, she feels as though she has equal strength and for me when I range her lower extremities she has equal strength in her hip flexors but decreased flexion on the right knee she says due to pain she has ankle flexion dorsiflexion equal bilaterally.  The patient will be admitted to the hospitalist.  For the remainder this patient's care please see inpatient team notes.  I will intervene as needed while the patient remains in the emergency department.   Final Clinical Impression(s) / ED Diagnoses Final diagnoses:  Weakness    Rx / DC Orders ED  Discharge Orders    None       Sabino Donovan, MD 09/02/19 1253  Sabino DonovanKatz, Verleen Stuckey C, MD 09/02/19 1344

## 2019-09-02 NOTE — ED Notes (Signed)
Pt discharged with PTAR back to [facility]

## 2019-09-02 NOTE — ED Triage Notes (Signed)
Patient arrives via [facility] from [facility] (assisted living) due being found slumped over in her bathroom. Per ems, the patient LKW was 2000 and she is usually ambulatory with a walker. She was found on the toilet (incontinent episode), and having difficulty standing (generalized weakness).   No blood thinners, neg stroke screen with EMS.   Arrives with 20g. L. AC. EMS Vitals: 167/80 BP 158 CBG 65 HR 97% RA

## 2019-09-04 ENCOUNTER — Encounter (HOSPITAL_COMMUNITY): Payer: Self-pay | Admitting: *Deleted

## 2019-09-04 ENCOUNTER — Emergency Department (HOSPITAL_COMMUNITY): Payer: Medicare Other

## 2019-09-04 ENCOUNTER — Emergency Department (HOSPITAL_COMMUNITY)
Admission: EM | Admit: 2019-09-04 | Discharge: 2019-09-06 | Disposition: A | Discharge status: Home / Self Care | Payer: Medicare Other | Attending: Emergency Medicine | Admitting: Emergency Medicine

## 2019-09-04 DIAGNOSIS — Y92129 Unspecified place in nursing home as the place of occurrence of the external cause: Secondary | ICD-10-CM | POA: Insufficient documentation

## 2019-09-04 DIAGNOSIS — S52182A Other fracture of upper end of left radius, initial encounter for closed fracture: Secondary | ICD-10-CM

## 2019-09-04 DIAGNOSIS — R531 Weakness: Secondary | ICD-10-CM | POA: Diagnosis not present

## 2019-09-04 DIAGNOSIS — I10 Essential (primary) hypertension: Secondary | ICD-10-CM | POA: Insufficient documentation

## 2019-09-04 DIAGNOSIS — Z9660 Presence of unspecified orthopedic joint implant: Secondary | ICD-10-CM | POA: Insufficient documentation

## 2019-09-04 DIAGNOSIS — Z20822 Contact with and (suspected) exposure to covid-19: Secondary | ICD-10-CM | POA: Diagnosis not present

## 2019-09-04 DIAGNOSIS — Z79899 Other long term (current) drug therapy: Secondary | ICD-10-CM | POA: Insufficient documentation

## 2019-09-04 DIAGNOSIS — Y999 Unspecified external cause status: Secondary | ICD-10-CM | POA: Diagnosis not present

## 2019-09-04 DIAGNOSIS — R4182 Altered mental status, unspecified: Secondary | ICD-10-CM | POA: Diagnosis not present

## 2019-09-04 DIAGNOSIS — Y9389 Activity, other specified: Secondary | ICD-10-CM | POA: Diagnosis not present

## 2019-09-04 DIAGNOSIS — X58XXXA Exposure to other specified factors, initial encounter: Secondary | ICD-10-CM | POA: Insufficient documentation

## 2019-09-04 DIAGNOSIS — R2689 Other abnormalities of gait and mobility: Secondary | ICD-10-CM | POA: Diagnosis not present

## 2019-09-04 DIAGNOSIS — S59912A Unspecified injury of left forearm, initial encounter: Secondary | ICD-10-CM | POA: Diagnosis present

## 2019-09-04 DIAGNOSIS — I6782 Cerebral ischemia: Secondary | ICD-10-CM | POA: Diagnosis not present

## 2019-09-04 HISTORY — DX: Unspecified hearing loss, unspecified ear: H91.90

## 2019-09-04 LAB — BASIC METABOLIC PANEL
Anion gap: 11 (ref 5–15)
BUN: 29 mg/dL — ABNORMAL HIGH (ref 8–23)
CO2: 24 mmol/L (ref 22–32)
Calcium: 9 mg/dL (ref 8.9–10.3)
Chloride: 107 mmol/L (ref 98–111)
Creatinine, Ser: 0.95 mg/dL (ref 0.44–1.00)
GFR calc Af Amer: 59 mL/min — ABNORMAL LOW (ref >60)
GFR calc non Af Amer: 51 mL/min — ABNORMAL LOW (ref >60)
Glucose, Bld: 116 mg/dL — ABNORMAL HIGH (ref 70–99)
Potassium: 3.8 mmol/L (ref 3.5–5.1)
Sodium: 142 mmol/L (ref 135–145)

## 2019-09-04 LAB — URINALYSIS, ROUTINE W REFLEX MICROSCOPIC
Bilirubin Urine: NEGATIVE
Glucose, UA: NEGATIVE mg/dL
Ketones, ur: 5 mg/dL — AB
Leukocytes,Ua: NEGATIVE
Nitrite: NEGATIVE
Protein, ur: 100 mg/dL — AB
Specific Gravity, Urine: 1.023 (ref 1.005–1.030)
pH: 5 (ref 5.0–8.0)

## 2019-09-04 LAB — CBC WITH DIFFERENTIAL/PLATELET
Abs Immature Granulocytes: 0.38 10*3/uL — ABNORMAL HIGH (ref 0.00–0.07)
Basophils Absolute: 0 10*3/uL (ref 0.0–0.1)
Basophils Relative: 0 %
Eosinophils Absolute: 0 10*3/uL (ref 0.0–0.5)
Eosinophils Relative: 0 %
HCT: 39 % (ref 36.0–46.0)
Hemoglobin: 11.8 g/dL — ABNORMAL LOW (ref 12.0–15.0)
Immature Granulocytes: 2 %
Lymphocytes Relative: 13 %
Lymphs Abs: 2.2 10*3/uL (ref 0.7–4.0)
MCH: 28.1 pg (ref 26.0–34.0)
MCHC: 30.3 g/dL (ref 30.0–36.0)
MCV: 92.9 fL (ref 80.0–100.0)
Monocytes Absolute: 1.1 10*3/uL — ABNORMAL HIGH (ref 0.1–1.0)
Monocytes Relative: 7 %
Neutro Abs: 13.4 10*3/uL — ABNORMAL HIGH (ref 1.7–7.7)
Neutrophils Relative %: 78 %
Platelets: 509 10*3/uL — ABNORMAL HIGH (ref 150–400)
RBC: 4.2 MIL/uL (ref 3.87–5.11)
RDW: 15.5 % (ref 11.5–15.5)
WBC: 17.1 10*3/uL — ABNORMAL HIGH (ref 4.0–10.5)
nRBC: 0 % (ref 0.0–0.2)

## 2019-09-04 LAB — LACTIC ACID, PLASMA: Lactic Acid, Venous: 0.9 mmol/L (ref 0.5–1.9)

## 2019-09-04 LAB — SARS CORONAVIRUS 2 BY RT PCR (HOSPITAL ORDER, PERFORMED IN ~~LOC~~ HOSPITAL LAB): SARS Coronavirus 2: NEGATIVE

## 2019-09-04 MED ORDER — LISINOPRIL 20 MG PO TABS
40.0000 mg | ORAL_TABLET | Freq: Every day | ORAL | Status: DC
  Administered 2019-09-04 – 2019-09-06 (×3): 40 mg via ORAL
  Filled 2019-09-04 (×3): qty 2

## 2019-09-04 MED ORDER — SODIUM CHLORIDE 0.9 % IV BOLUS
250.0000 mL | Freq: Once | INTRAVENOUS | Status: AC
  Administered 2019-09-04: 250 mL via INTRAVENOUS

## 2019-09-04 NOTE — ED Notes (Signed)
Meal tray provided.

## 2019-09-04 NOTE — Evaluation (Signed)
Physical Therapy Evaluation Patient Details Name: Caitlyn Braun MRN: [id number] DOB: 1922-01-21 Today's Date: 09/04/2019   History of Present Illness  Pt is a 86 y/o female presenting from ALF secondary to AMS. Also found to have L radial fx. PMH includes HTN and HOH.   Clinical Impression  Pt admitted secondary to problem above with deficits below. Pt requiring max A to come to long sitting on stretcher in ED. Pt complaining of pain in LUE and LUE immobilized throughout. Feel pt will require SNF level therapies at d/c given current deficits. Will continue to follow acutely to maximize functional mobility independence and safety.     Follow Up Recommendations SNF;Supervision/Assistance - 24 hour    Equipment Recommendations  None recommended by PT    Recommendations for Other Services       Precautions / Restrictions Precautions Precautions: Fall Restrictions Weight Bearing Restrictions: No      Mobility  Bed Mobility Overal bed mobility: Needs Assistance Bed Mobility: Supine to Sit     Supine to sit: Max assist     General bed mobility comments: Came to long sitting on ED stretcher with max A. Able to use RUE to assist with mobility some. Further mobility deferred as unsafe to attempt with +1 from higher stretcher height.   Transfers                    Ambulation/Gait                Stairs            Wheelchair Mobility    Modified Rankin (Stroke Patients Only)       Balance                                             Pertinent Vitals/Pain Pain Assessment: Faces Faces Pain Scale: Hurts little more Pain Location: LUE  Pain Descriptors / Indicators: Grimacing Pain Intervention(s): Limited activity within patient's tolerance;Monitored during session;Repositioned    Home Living Family/patient expects to be discharged to:: Assisted living               Home Equipment: Walker - 2 wheels      Prior Function  Level of Independence: Needs assistance   Gait / Transfers Assistance Needed: Has not been able to ambulate since previous admission. Was ambulating with rollator  ADL's / Homemaking Assistance Needed: Staff assists with IADLs.         Hand Dominance        Extremity/Trunk Assessment   Upper Extremity Assessment Upper Extremity Assessment: LUE deficits/detail LUE Deficits / Details: LUE splinted and in sling     Lower Extremity Assessment Lower Extremity Assessment: Generalized weakness;RLE deficits/detail;LLE deficits/detail RLE Deficits / Details: RLE grossly 3/5 throughout LLE Deficits / Details: LLE grossly 3+/5 throughout     Cervical / Trunk Assessment Cervical / Trunk Assessment: Kyphotic;Other exceptions Cervical / Trunk Exceptions: Pt with L lateral cervical lean  Communication   Communication: HOH  Cognition Arousal/Alertness: Awake/alert Behavior During Therapy: WFL for tasks assessed/performed Overall Cognitive Status: Impaired/Different from baseline Area of Impairment: Problem solving                             Problem Solving: Slow processing        General Comments  Exercises     Assessment/Plan    PT Assessment Patient needs continued PT services  PT Problem List Decreased strength;Decreased range of motion;Decreased activity tolerance;Decreased balance;Decreased mobility;Decreased cognition;Decreased knowledge of use of DME;Decreased safety awareness;Decreased knowledge of precautions;Pain       PT Treatment Interventions DME instruction;Gait training;Functional mobility training;Therapeutic activities;Therapeutic exercise;Balance training;Neuromuscular re-education;Cognitive remediation;Patient/family education;Wheelchair mobility training    PT Goals (Current goals can be found in the Care Plan section)  Acute Rehab PT Goals Patient Stated Goal: to get something to eat PT Goal Formulation: With patient Time For Goal  Achievement: 09/18/19 Potential to Achieve Goals: Fair    Frequency Min 2X/week   Barriers to discharge        Co-evaluation               AM-PAC PT "6 Clicks" Mobility  Outcome Measure Help needed turning from your back to your side while in a flat bed without using bedrails?: Total Help needed moving from lying on your back to sitting on the side of a flat bed without using bedrails?: Total Help needed moving to and from a bed to a chair (including a wheelchair)?: Total Help needed standing up from a chair using your arms (e.g., wheelchair or bedside chair)?: Total Help needed to walk in hospital room?: Total Help needed climbing 3-5 steps with a railing? : Total 6 Click Score: 6    End of Session   Activity Tolerance: Patient limited by pain Patient left: in bed;with family/visitor present (on stretcher) Nurse Communication: Mobility status PT Visit Diagnosis: Other abnormalities of gait and mobility (R26.89);Muscle weakness (generalized) (M62.81);Difficulty in walking, not elsewhere classified (R26.2)    Time: 1730-1750 PT Time Calculation (min) (ACUTE ONLY): 20 min   Charges:   PT Evaluation $PT Eval Moderate Complexity: 1 Mod          Farley Ly, PT, DPT  Acute Rehabilitation Services  Pager: [phone] Office: [phone]   Lehman Prom 09/04/2019, 6:02 PM

## 2019-09-04 NOTE — NC FL2 (Signed)
  [place] MEDICAID FL2 LEVEL OF CARE SCREENING TOOL     IDENTIFICATION  Patient Name: Caitlyn Braun Birthdate: 1922/07/26 Sex: female Admission Date (Current Location): 09/04/2019  [facility] and [place] Number:  Producer, television/film/video and Address:  [facility] [address], [address] [place], [place] [address]      Provider Number: [id number]  Attending Physician Name and Address:  Pollyann Savoy, MD  Relative Name and Phone Number:  Millena, Callins   [phone]    Current Level of Care: SNF Recommended Level of Care: Skilled Nursing Facility Prior Approval Number:    Date Approved/Denied:   PASRR Number: [id number]  Discharge Plan: SNF    Current Diagnoses: Patient Active Problem List   Diagnosis Date Noted  . Weakness 09/02/2019  . URI (upper respiratory infection) 09/02/2019    Orientation RESPIRATION BLADDER Height & Weight     Self, Time, Situation, Place  Normal Continent (Continent at baseline/catheter during hospitalization) Weight: 125 lb (56.7 kg) Height:  5\' 3"  (160 cm)  BEHAVIORAL SYMPTOMS/MOOD NEUROLOGICAL BOWEL NUTRITION STATUS      Continent Diet (heart healthy)  AMBULATORY STATUS COMMUNICATION OF NEEDS Skin   Extensive Assist Verbally Bruising (left leg due to fall)                       Personal Care Assistance Level of Assistance  Bathing, Feeding, Dressing Bathing Assistance: Maximum assistance Feeding assistance: Maximum assistance Dressing Assistance: Maximum assistance     Functional Limitations Info  Sight, Hearing, Speech Sight Info: Adequate Hearing Info: Impaired (Difficulty hearing please increase volume) Speech Info: Adequate    SPECIAL CARE FACTORS FREQUENCY  PT (By licensed PT), OT (By licensed OT)     PT Frequency: 5x weekly OT Frequency: 5x weekly            Contractures Contractures Info: Not present    Additional Factors Info  Code Status Code Status Info: DNR-on file at  [facility]             Current Medications (09/04/2019):  This is the current hospital active medication list No current facility-administered medications for this encounter.   Current Outpatient Medications  Medication Sig Dispense Refill  . azithromycin (ZITHROMAX) 250 MG tablet Take 2 tablets on the first day, followed by 1 tablet the following 4 days. (Patient taking differently: Take 250 mg by mouth daily. ) 6 tablet 0  . lisinopril (ZESTRIL) 40 MG tablet Take 40 mg by mouth daily.        Discharge Medications: Please see discharge summary for a list of discharge medications.  Relevant Imaging Results:  Relevant Lab Results:   Additional Information ssn#[id number]  09-05-2004, LCSW
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
## 2019-09-04 NOTE — Discharge Instructions (Addendum)
Keep splint on with sling. Do not bear any weight with your left arm until you can follow up with Dr. Eulah Pont - please call to schedule an appointment for this week or early next week

## 2019-09-04 NOTE — Progress Notes (Signed)
Orthopedic Tech Progress Note Patient Details:  Caitlyn Braun 1922-11-26 [id number]  Ortho Devices Type of Ortho Device: Shoulder immobilizer, Long arm splint, Cotton web roll Ortho Device/Splint Location: LUE Ortho Device/Splint Interventions: Ordered, Application   Post Interventions Patient Tolerated: Well Instructions Provided: Care of device   Donald Pore 09/04/2019, 5:06 PM
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
## 2019-09-05 ENCOUNTER — Other Ambulatory Visit: Payer: Self-pay

## 2019-09-05 DIAGNOSIS — S52182A Other fracture of upper end of left radius, initial encounter for closed fracture: Secondary | ICD-10-CM | POA: Diagnosis not present

## 2019-09-05 LAB — LACTIC ACID, PLASMA: Lactic Acid, Venous: 1.7 mmol/L (ref 0.5–1.9)

## 2019-09-05 MED ORDER — ACETAMINOPHEN 325 MG PO TABS
650.0000 mg | ORAL_TABLET | Freq: Once | ORAL | Status: AC
  Administered 2019-09-05: 650 mg via ORAL
  Filled 2019-09-05: qty 2

## 2019-09-05 NOTE — ED Notes (Signed)
PT changed.

## 2019-09-05 NOTE — ED Notes (Signed)
Patient's son at bedside.

## 2019-09-05 NOTE — Progress Notes (Signed)
CSW spoke with patient's son Maurine Minister who is in agreement to accept the bed offer from [facility] for short term rehab.   CSW notified [place] of the acceptance of the bed offer from [facility].  CSW will initiate insurance authorization from [facility].  Edwin Dada, MSW, LCSW-A Transitions of Care   Clinical Social Worker  [facility] Emergency Departments   Medical ICU [phone]

## 2019-09-05 NOTE — Progress Notes (Signed)
CSW checked [employer] and found that authorization had been approved for SNF stay.  CSW updated Lacinda Axon, but there was no one to receive Pt at this time. Pt can be transported first thing 8/18.  CSW updated Pt and son.   [id number] SNF 09/05/2019

## 2019-09-05 NOTE — Progress Notes (Signed)
CSW received call from [place], case worker from [facility] gathering more information about specifics of case.

## 2019-09-05 NOTE — ED Notes (Signed)
Ordered breakfast 

## 2019-09-05 NOTE — ED Notes (Signed)
Dinner ordered 

## 2019-09-06 DIAGNOSIS — S52182A Other fracture of upper end of left radius, initial encounter for closed fracture: Secondary | ICD-10-CM | POA: Diagnosis not present

## 2019-09-06 NOTE — ED Notes (Signed)
2 attempts to call report to [facility]

## 2019-09-06 NOTE — Progress Notes (Signed)
This patient will go to [place] via [place]. The patient will go to room 116A. The number to call for report is [phone].  Edwin Dada, MSW, LCSW-A Transitions of Care  Clinical Social Worker  [facility] Emergency Departments  Medical ICU [phone]

## 2019-09-06 NOTE — ED Notes (Signed)
Called [facility] for  Transportation  

## 2019-09-06 NOTE — ED Notes (Signed)
3rd attempt to cal report to [employer]

## 2019-09-06 NOTE — ED Notes (Signed)
Breakfast Ordered 

## 2021-05-18 ENCOUNTER — Other Ambulatory Visit: Payer: Self-pay

## 2021-05-18 ENCOUNTER — Observation Stay (HOSPITAL_COMMUNITY): Payer: Medicare Other

## 2021-05-18 ENCOUNTER — Inpatient Hospital Stay (HOSPITAL_COMMUNITY)
Admission: EM | Admit: 2021-05-18 | Discharge: 2021-05-24 | DRG: 394 | Disposition: A | Discharge status: Hospice | Payer: Medicare Other | Source: Skilled Nursing Facility | Attending: Internal Medicine | Admitting: Internal Medicine

## 2021-05-18 DIAGNOSIS — N6310 Unspecified lump in the right breast, unspecified quadrant: Secondary | ICD-10-CM | POA: Diagnosis present

## 2021-05-18 DIAGNOSIS — R42 Dizziness and giddiness: Secondary | ICD-10-CM

## 2021-05-18 DIAGNOSIS — N179 Acute kidney failure, unspecified: Secondary | ICD-10-CM | POA: Diagnosis not present

## 2021-05-18 DIAGNOSIS — Z20822 Contact with and (suspected) exposure to covid-19: Secondary | ICD-10-CM | POA: Diagnosis present

## 2021-05-18 DIAGNOSIS — D62 Acute posthemorrhagic anemia: Secondary | ICD-10-CM | POA: Diagnosis present

## 2021-05-18 DIAGNOSIS — R55 Syncope and collapse: Secondary | ICD-10-CM | POA: Diagnosis not present

## 2021-05-18 DIAGNOSIS — Z66 Do not resuscitate: Secondary | ICD-10-CM | POA: Diagnosis present

## 2021-05-18 DIAGNOSIS — K5909 Other constipation: Secondary | ICD-10-CM | POA: Diagnosis present

## 2021-05-18 DIAGNOSIS — R600 Localized edema: Secondary | ICD-10-CM | POA: Diagnosis present

## 2021-05-18 DIAGNOSIS — K5900 Constipation, unspecified: Secondary | ICD-10-CM | POA: Diagnosis present

## 2021-05-18 DIAGNOSIS — I9589 Other hypotension: Secondary | ICD-10-CM | POA: Diagnosis present

## 2021-05-18 DIAGNOSIS — R109 Unspecified abdominal pain: Secondary | ICD-10-CM | POA: Diagnosis present

## 2021-05-18 DIAGNOSIS — Z7982 Long term (current) use of aspirin: Secondary | ICD-10-CM

## 2021-05-18 DIAGNOSIS — R1084 Generalized abdominal pain: Secondary | ICD-10-CM | POA: Diagnosis not present

## 2021-05-18 DIAGNOSIS — Z79899 Other long term (current) drug therapy: Secondary | ICD-10-CM

## 2021-05-18 DIAGNOSIS — Z96642 Presence of left artificial hip joint: Secondary | ICD-10-CM | POA: Diagnosis present

## 2021-05-18 DIAGNOSIS — L8990 Pressure ulcer of unspecified site, unspecified stage: Secondary | ICD-10-CM | POA: Insufficient documentation

## 2021-05-18 DIAGNOSIS — D509 Iron deficiency anemia, unspecified: Secondary | ICD-10-CM

## 2021-05-18 DIAGNOSIS — K559 Vascular disorder of intestine, unspecified: Secondary | ICD-10-CM | POA: Diagnosis not present

## 2021-05-18 DIAGNOSIS — I10 Essential (primary) hypertension: Secondary | ICD-10-CM | POA: Diagnosis present

## 2021-05-18 DIAGNOSIS — D649 Anemia, unspecified: Secondary | ICD-10-CM | POA: Diagnosis not present

## 2021-05-18 DIAGNOSIS — G629 Polyneuropathy, unspecified: Secondary | ICD-10-CM | POA: Diagnosis present

## 2021-05-18 DIAGNOSIS — R531 Weakness: Secondary | ICD-10-CM

## 2021-05-18 DIAGNOSIS — R195 Other fecal abnormalities: Secondary | ICD-10-CM

## 2021-05-18 DIAGNOSIS — K529 Noninfective gastroenteritis and colitis, unspecified: Secondary | ICD-10-CM | POA: Diagnosis present

## 2021-05-18 DIAGNOSIS — Z515 Encounter for palliative care: Secondary | ICD-10-CM

## 2021-05-18 DIAGNOSIS — Z993 Dependence on wheelchair: Secondary | ICD-10-CM

## 2021-05-18 DIAGNOSIS — Z7189 Other specified counseling: Secondary | ICD-10-CM

## 2021-05-18 DIAGNOSIS — L89311 Pressure ulcer of right buttock, stage 1: Secondary | ICD-10-CM | POA: Diagnosis present

## 2021-05-18 DIAGNOSIS — N6341 Unspecified lump in right breast, subareolar: Secondary | ICD-10-CM

## 2021-05-18 DIAGNOSIS — L89322 Pressure ulcer of left buttock, stage 2: Secondary | ICD-10-CM | POA: Diagnosis present

## 2021-05-18 DIAGNOSIS — E538 Deficiency of other specified B group vitamins: Secondary | ICD-10-CM

## 2021-05-18 LAB — TROPONIN I (HIGH SENSITIVITY): Troponin I (High Sensitivity): 24 ng/L — ABNORMAL HIGH (ref <18)

## 2021-05-18 LAB — COMPREHENSIVE METABOLIC PANEL
ALT: 9 U/L (ref 0–44)
AST: 24 U/L (ref 15–41)
Albumin: 3.3 g/dL — ABNORMAL LOW (ref 3.5–5.0)
Alkaline Phosphatase: 75 U/L (ref 38–126)
Anion gap: 13 (ref 5–15)
BUN: 28 mg/dL — ABNORMAL HIGH (ref 8–23)
CO2: 24 mmol/L (ref 22–32)
Calcium: 9.4 mg/dL (ref 8.9–10.3)
Chloride: 100 mmol/L (ref 98–111)
Creatinine, Ser: 1.27 mg/dL — ABNORMAL HIGH (ref 0.44–1.00)
GFR, Estimated: 38 mL/min — ABNORMAL LOW (ref >60)
Glucose, Bld: 129 mg/dL — ABNORMAL HIGH (ref 70–99)
Potassium: 4.3 mmol/L (ref 3.5–5.1)
Sodium: 137 mmol/L (ref 135–145)
Total Bilirubin: 0.6 mg/dL (ref 0.3–1.2)
Total Protein: 6.2 g/dL — ABNORMAL LOW (ref 6.5–8.1)

## 2021-05-18 LAB — FERRITIN: Ferritin: 7 ng/mL — ABNORMAL LOW (ref 11–307)

## 2021-05-18 LAB — RETICULOCYTES
Immature Retic Fract: 23.9 % — ABNORMAL HIGH (ref 2.3–15.9)
RBC.: 3.64 MIL/uL — ABNORMAL LOW (ref 3.87–5.11)
Retic Count, Absolute: 44 10*3/uL (ref 19.0–186.0)
Retic Ct Pct: 1.2 % (ref 0.4–3.1)

## 2021-05-18 LAB — CK: Total CK: 106 U/L (ref 38–234)

## 2021-05-18 LAB — CBC WITH DIFFERENTIAL/PLATELET
Abs Immature Granulocytes: 0.09 10*3/uL — ABNORMAL HIGH (ref 0.00–0.07)
Basophils Absolute: 0 10*3/uL (ref 0.0–0.1)
Basophils Relative: 0 %
Eosinophils Absolute: 0.4 10*3/uL (ref 0.0–0.5)
Eosinophils Relative: 2 %
HCT: 30.4 % — ABNORMAL LOW (ref 36.0–46.0)
Hemoglobin: 9.2 g/dL — ABNORMAL LOW (ref 12.0–15.0)
Immature Granulocytes: 1 %
Lymphocytes Relative: 32 %
Lymphs Abs: 5.3 10*3/uL — ABNORMAL HIGH (ref 0.7–4.0)
MCH: 25.3 pg — ABNORMAL LOW (ref 26.0–34.0)
MCHC: 30.3 g/dL (ref 30.0–36.0)
MCV: 83.7 fL (ref 80.0–100.0)
Monocytes Absolute: 0.6 10*3/uL (ref 0.1–1.0)
Monocytes Relative: 4 %
Neutro Abs: 10.2 10*3/uL — ABNORMAL HIGH (ref 1.7–7.7)
Neutrophils Relative %: 61 %
Platelets: 461 10*3/uL — ABNORMAL HIGH (ref 150–400)
RBC: 3.63 MIL/uL — ABNORMAL LOW (ref 3.87–5.11)
RDW: 16.1 % — ABNORMAL HIGH (ref 11.5–15.5)
WBC: 16.6 10*3/uL — ABNORMAL HIGH (ref 4.0–10.5)
nRBC: 0 % (ref 0.0–0.2)

## 2021-05-18 LAB — POC OCCULT BLOOD, ED: Fecal Occult Bld: POSITIVE — AB

## 2021-05-18 LAB — PROTIME-INR
INR: 1 (ref 0.8–1.2)
Prothrombin Time: 13.5 seconds (ref 11.4–15.2)

## 2021-05-18 LAB — HEPATIC FUNCTION PANEL
ALT: 11 U/L (ref 0–44)
AST: 25 U/L (ref 15–41)
Albumin: 3.3 g/dL — ABNORMAL LOW (ref 3.5–5.0)
Alkaline Phosphatase: 70 U/L (ref 38–126)
Bilirubin, Direct: <0.1 mg/dL (ref 0.0–0.2)
Total Bilirubin: 0.5 mg/dL (ref 0.3–1.2)
Total Protein: 6.3 g/dL — ABNORMAL LOW (ref 6.5–8.1)

## 2021-05-18 LAB — IRON AND TIBC
Iron: 22 ug/dL — ABNORMAL LOW (ref 28–170)
Saturation Ratios: 5 % — ABNORMAL LOW (ref 10.4–31.8)
TIBC: 433 ug/dL (ref 250–450)
UIBC: 411 ug/dL

## 2021-05-18 LAB — TSH: TSH: 31.244 u[IU]/mL — ABNORMAL HIGH (ref 0.350–4.500)

## 2021-05-18 LAB — VITAMIN B12: Vitamin B-12: 128 pg/mL — ABNORMAL LOW (ref 180–914)

## 2021-05-18 LAB — PHOSPHORUS: Phosphorus: 4.1 mg/dL (ref 2.5–4.6)

## 2021-05-18 LAB — LIPASE, BLOOD: Lipase: 52 U/L — ABNORMAL HIGH (ref 11–51)

## 2021-05-18 LAB — FOLATE: Folate: 13.1 ng/mL (ref >5.9)

## 2021-05-18 LAB — MAGNESIUM: Magnesium: 2.3 mg/dL (ref 1.7–2.4)

## 2021-05-18 MED ORDER — PANTOPRAZOLE SODIUM 40 MG IV SOLR
40.0000 mg | Freq: Every day | INTRAVENOUS | Status: DC
  Administered 2021-05-19: 40 mg via INTRAVENOUS
  Filled 2021-05-18: qty 10

## 2021-05-18 MED ORDER — SODIUM CHLORIDE 0.9 % IV SOLN: Freq: Once | INTRAVENOUS | Status: AC

## 2021-05-18 MED ORDER — HYDROCODONE-ACETAMINOPHEN 5-325 MG PO TABS
1.0000 | ORAL_TABLET | ORAL | Status: DC | PRN
  Administered 2021-05-19 – 2021-05-20 (×3): 1 via ORAL
  Filled 2021-05-18 (×3): qty 1

## 2021-05-18 MED ORDER — IOHEXOL 300 MG/ML  SOLN
100.0000 mL | Freq: Once | INTRAMUSCULAR | Status: AC | PRN
  Administered 2021-05-18: 100 mL via INTRAVENOUS

## 2021-05-18 MED ORDER — SODIUM CHLORIDE 0.9 % IV SOLN
75.0000 mL/h | INTRAVENOUS | Status: AC
  Administered 2021-05-18: 75 mL/h via INTRAVENOUS

## 2021-05-18 MED ORDER — ACETAMINOPHEN 325 MG PO TABS
650.0000 mg | ORAL_TABLET | Freq: Four times a day (QID) | ORAL | Status: DC | PRN
  Administered 2021-05-19: 650 mg via ORAL
  Filled 2021-05-18: qty 2

## 2021-05-18 MED ORDER — SODIUM CHLORIDE 0.9 % IV BOLUS
500.0000 mL | Freq: Once | INTRAVENOUS | Status: AC
  Administered 2021-05-18: 500 mL via INTRAVENOUS

## 2021-05-18 MED ORDER — ACETAMINOPHEN 650 MG RE SUPP
650.0000 mg | Freq: Four times a day (QID) | RECTAL | Status: DC | PRN
Start: 2021-05-18 — End: 2021-05-24

## 2021-05-18 NOTE — Assessment & Plan Note (Signed)
Patient states she is aware but did not want to pursue that ?

## 2021-05-18 NOTE — ED Provider Notes (Signed)
?[facility] EMERGENCY DEPARTMENT ?Provider Note ? ? ?CSN: [id number] ?Arrival date & time: 05/18/21  1931 ? ?  ? ?History ? ?No chief complaint on file. ? ? ?Terre Hanneman is a 88 y.o. female. ? ?HPI ? ?88 year old female with past medical history of HTN presents emergency department after a "unresponsive episode".  Patient is from a living facility.  She was reportedly found slumped over in her wheelchair.  When EMS got there they say that she was slow to respond, hypotensive.  Once she was laid down on the stretcher became alert.  Patient is fatigued on arrival, hard of hearing but appears oriented.  Is complaining of lower abdominal cramping with the urge to defecate.  That is the last thing that she remembers while sitting in her wheelchair before arriving at the hospital.  Denies any headache, chest pain, difficulty breathing.  No recent reported illness or medication change. ? ?Home Medications ?Prior to Admission medications   ?Medication Sig Start Date End Date Taking? Authorizing Provider  ?azithromycin (ZITHROMAX) 250 MG tablet Take 2 tablets on the first day, followed by 1 tablet the following 4 days. ?Patient taking differently: Take 250 mg by mouth daily.  09/02/19   [place], Carley D, DO  ?lisinopril (ZESTRIL) 40 MG tablet Take 40 mg by mouth daily.     [provider]  ?   ? ?Allergies    ?Patient has no known allergies.   ? ?Review of Systems   ?Review of Systems  ?Constitutional:  Positive for fatigue. Negative for fever.  ?Respiratory:  Negative for shortness of breath.   ?Cardiovascular:  Negative for chest pain.  ?Gastrointestinal:  Positive for abdominal pain and constipation. Negative for anal bleeding, diarrhea, nausea and vomiting.  ?Skin:  Negative for rash.  ?Neurological:  Negative for headaches.  ? ?Physical Exam ?Updated Vital Signs ?There were no vitals taken for this visit. ?Physical Exam ?Vitals and nursing note reviewed.  ?Constitutional:   ?   General:  She is not in acute distress. ?   Appearance: Normal appearance. She is not diaphoretic.  ?HENT:  ?   Head: Normocephalic.  ?   Mouth/Throat:  ?   Mouth: Mucous membranes are moist.  ?Cardiovascular:  ?   Rate and Rhythm: Normal rate.  ?Pulmonary:  ?   Effort: Pulmonary effort is normal. No respiratory distress.  ?Abdominal:  ?   Palpations: Abdomen is soft.  ?   Tenderness: There is no abdominal tenderness. There is no guarding.  ?Skin: ?   General: Skin is warm.  ?Neurological:  ?   Mental Status: She is alert and oriented to person, place, and time. Mental status is at baseline.  ?Psychiatric:     ?   Mood and Affect: Mood normal.  ? ? ?ED Results / Procedures / Treatments   ?Labs ?(all labs ordered are listed, but only abnormal results are displayed) ?Labs Reviewed  ?CBC WITH DIFFERENTIAL/PLATELET  ?COMPREHENSIVE METABOLIC PANEL  ?URINALYSIS, ROUTINE W REFLEX MICROSCOPIC  ?LIPASE, BLOOD  ? ? ?EKG ?None ? ?Radiology ?No results found. ? ?Procedures ?Procedures  ? ? ?Medications Ordered in ED ?Medications - No data to display ? ?ED Course/ Medical Decision Making/ A&P ?  ?                        ?Medical Decision Making ?Amount and/or Complexity of Data Reviewed ?Labs: ordered. ? ? ?88 year old female presents the emergency department after what appears to  be an unresponsive episode, was hypotensive when EMS arrived with a systolic just under 100.  Mentation and blood pressure improved when she is laid flat on the stretcher.  Her only complaint is lower abdominal cramping and the urge to defecate.  Patient got a small fluid bolus prior to arrival and initial blood pressure is appropriate. ? ?Patient's abdomen is soft, when we had her rolled on the side she had what appeared to be a large stool ball that was passed.  Following this she had large amounts of soft brown stool that proceeded to pass after that.  No black stool, no signs of bleeding.  Following this patient felt significantly improved, blood pressure  improved as well. ? ?Blood work shows a slight anemia of 9.2, down from a baseline of 11-12.  There is a slight AKI as well.  We tested the stool which is fecal occult positive.  Concern for constipation/GI bleed in the setting of anemia and an unresponsive episode with hypotension.  Plan for medical admission, will notify GI service.  She is not anticoagulated, vitals are stable and she is sitting up and awake.  Patients evaluation and results requires admission for further treatment and care.  Spoke with hospitalist, reviewed patient's ED course and they accept admission.  Patient agrees with admission plan, offers no new complaints and is stable/unchanged at time of admit. ? ? ? ? ? ? ? ?Final Clinical Impression(s) / ED Diagnoses ?Final diagnoses:  ?None  ? ? ?Rx / DC Orders ?ED Discharge Orders   ? ? None  ? ?  ? ? ?  ?Rozelle Logan, DO ?05/18/21 2114 ? ?

## 2021-05-18 NOTE — Assessment & Plan Note (Signed)
Obtain urine electrolytes and rehydrate ?

## 2021-05-18 NOTE — Assessment & Plan Note (Signed)
Will need pt ot eval 

## 2021-05-18 NOTE — Subjective & Objective (Signed)
Was found in wheelchair slow to respond, she was having abdominal cramps ?

## 2021-05-18 NOTE — Assessment & Plan Note (Addendum)
Had a large BM now has persistent diarrhea ? will order stool studies  ?Give abdominal tenderness ct abd has been ordered ?

## 2021-05-18 NOTE — Assessment & Plan Note (Signed)
Will obtain CT abdomen ? ?

## 2021-05-18 NOTE — ED Triage Notes (Signed)
BIB [facility] from [facility] after staff found pt unresponsive in wheelchair. Pt initally hypotensive with BP of 100/80. 224ml NS bolus administered en route to facility. Upon arrival to ED, pt responsive to verbal stimuli and answers questions appropriate. Skin appears cool and clammy.  ? ?EMS Vitals ? BP 119/48 ?HR 66 ?CBG 161 ?

## 2021-05-18 NOTE — H&P (Signed)
? ? Caitlyn Braun DDU:[id number] DOB: [birth year] DOA: 05/18/2021 ?  ?  ?PCP: Patient, No Pcp Per (Inactive)   ?Outpatient Specialists:  NONE ?  ? ?Patient arrived to ER on 05/18/21 at 1931 ?Referred by Attending Horton, Clabe Seal, DO ? ? ?Patient coming from:   ?  ?From facility  [place] [place] [facility]  ? ?Chief Complaint: presyncope, hypotensive ? ? ?HPI: ?Caitlyn Braun is a 88 y.o. female with medical history significant of HOH, HTN  ?  ? ?Presented with   unresponsive episode ?Was found in wheelchair slow to respond, she was having abdominal cramps ? When EMS got there they say that she was slow to respond, hypotensive.  At the facility was 98 when she laid down it was in 100's ?She reports there is a growth in her right breast but she does not want this investigated ?  ?Regarding pertinent Chronic problems:   ?  ? HTN on on Lisinopril  ?  Chronic anemia - baseline hg Hemoglobin & Hematocrit  ?Recent Labs  ?  05/18/21 ?1945  ?HGB 9.2*  ?  ? ?While in ER:  ?Noted to have large stool ball that came out followed by large amount of brown stool no blood in rectum ?Brown stool was hemoccult positive ? There after pt developed severe diarrhea ? ? ?CXR -  NON acute ? ?CTabd/pelvis -   ?Ordered ?  ?Following Medications were ordered in ER: ?Medications  ?sodium chloride 0.9 % bolus 500 mL (500 mLs Intravenous New Bag/Given 05/18/21 2005)  ?0.9 %  sodium chloride infusion ( Intravenous New Bag/Given 05/18/21 2132)  ?  ?_______________________________________________________ ?ER Provider Called:  [facility]    Dr. Maurine Minister ?They Recommend admit to medicine   ?Will see in AM   ?  ?ED Triage Vitals [05/18/21 1956]  ?Enc Vitals Group  ?   BP (!) 110/47  ?   Pulse Rate 66  ?   Resp (!) 26  ?   Temp (!) 97.4 ?F (36.3 ?C)  ?   Temp Source Oral  ?   SpO2 100 %  ?   Weight   ?   Height   ?   Head Circumference   ?   Peak Flow   ?   Pain Score 0  ?   Pain Loc   ?   Pain Edu?   ?   Excl. in GC?   ?CBJS(28)@    ?  _________________________________________ ?Significant initial  Findings: ?Abnormal Labs Reviewed  ?CBC WITH DIFFERENTIAL/PLATELET - Abnormal; Notable for the following components:  ?    Result Value  ? WBC 16.6 (*)   ? RBC 3.63 (*)   ? Hemoglobin 9.2 (*)   ? HCT 30.4 (*)   ? MCH 25.3 (*)   ? RDW 16.1 (*)   ? Platelets 461 (*)   ? Neutro Abs 10.2 (*)   ? Lymphs Abs 5.3 (*)   ? Abs Immature Granulocytes 0.09 (*)   ? All other components within normal limits  ?COMPREHENSIVE METABOLIC PANEL - Abnormal; Notable for the following components:  ? Glucose, Bld 129 (*)   ? BUN 28 (*)   ? Creatinine, Ser 1.27 (*)   ? Total Protein 6.2 (*)   ? Albumin 3.3 (*)   ? GFR, Estimated 38 (*)   ? All other components within normal limits  ?LIPASE, BLOOD - Abnormal; Notable for the following components:  ? Lipase 52 (*)   ? All other components within normal limits  ?  HEPATIC FUNCTION PANEL - Abnormal; Notable for the following components:  ? Total Protein 6.3 (*)   ? Albumin 3.3 (*)   ? All other components within normal limits  ?TSH - Abnormal; Notable for the following components:  ? TSH 31.244 (*)   ? All other components within normal limits  ?VITAMIN B12 - Abnormal; Notable for the following components:  ? Vitamin B-12 128 (*)   ? All other components within normal limits  ?IRON AND TIBC - Abnormal; Notable for the following components:  ? Iron 22 (*)   ? Saturation Ratios 5 (*)   ? All other components within normal limits  ?FERRITIN - Abnormal; Notable for the following components:  ? Ferritin 7 (*)   ? All other components within normal limits  ?RETICULOCYTES - Abnormal; Notable for the following components:  ? RBC. 3.64 (*)   ? Immature Retic Fract 23.9 (*)   ? All other components within normal limits  ?POC OCCULT BLOOD, ED - Abnormal; Notable for the following components:  ? Fecal Occult Bld POSITIVE (*)   ? All other components within normal limits  ?TROPONIN I (HIGH SENSITIVITY) - Abnormal; Notable for the following  components:  ? Troponin I (High Sensitivity) 24 (*)   ? All other components within normal limits  ?  ?_________________________ ?Troponin  ordered ?ECG: Ordered ?Personally reviewed by me showing: ?HR : 67 ?Rhythm: Sinus rhythm ?Prolonged PR interval ?Minimal ST depression, diffuse leads ?QTC 470 ?  ? ?The recent clinical data is shown below. ?Vitals:  ? 05/18/21 1956 05/18/21 2000 05/18/21 2030  ?BP: (!) 110/47 (!) 109/53 121/73  ?Pulse: 66 71 (!) 48  ?Resp: (!) 26 (!) 23 12  ?Temp: (!) 97.4 ?F (36.3 ?C)    ?TempSrc: Oral    ?SpO2: 100% 97%   ?  ?  ?WBC ? ?   ?Component Value Date/Time  ? WBC 16.6 (H) 05/18/2021 1945  ? LYMPHSABS 5.3 (H) 05/18/2021 1945  ? MONOABS 0.6 05/18/2021 1945  ? EOSABS 0.4 05/18/2021 1945  ? BASOSABS 0.0 05/18/2021 1945  ? ?   ? UA   ordered ?   ?_______________________________________________ ?Hospitalist was called for admission for  presyncope, symptomatica anemia, AKI ?  ?The following Work up has been ordered so far: ? ?Orders Placed This Encounter  ?Procedures  ? CBC with Differential  ? Comprehensive metabolic panel  ? Urinalysis, Routine w reflex microscopic  ? Lipase, blood  ? [facility]  ? POC occult blood, ED  ? ED EKG  ? EKG 12-Lead  ? Type and screen [facility]  ?  ? ?OTHER Significant initial  Findings: ? ?labs showing:  ?  ?Recent Labs  ?Lab 05/18/21 ?1945  ?NA 137  ?K 4.3  ?CO2 24  ?GLUCOSE 129*  ?BUN 28*  ?CREATININE 1.27*  ?CALCIUM 9.4  ? ? ?Cr   Up from baseline see below ?Lab Results  ?Component Value Date  ? CREATININE 1.27 (H) 05/18/2021  ? CREATININE 0.95 09/04/2019  ? CREATININE 0.99 09/02/2019  ? ? ?Recent Labs  ?Lab 05/18/21 ?1945  ?AST 24  ?ALT 9  ?ALKPHOS 75  ?BILITOT 0.6  ?PROT 6.2*  ?ALBUMIN 3.3*  ? ?Lab Results  ?Component Value Date  ? CALCIUM 9.4 05/18/2021  ? ?     ?   ?Plt: ?Lab Results  ?Component Value Date  ? PLT 461 (H) 05/18/2021  ? ?  ?COVID-19 Labs ? ?Recent Labs  ?  05/18/21 ?1945  ?FERRITIN 7*   ? ? ?Lab Results  ?Component Value Date  ? SARSCOV2NAA NEGATIVE 09/04/2019  ? SARSCOV2NAA NEGATIVE 09/02/2019  ? ? ?  ?Recent Labs  ?Lab 05/18/21 ?1945  ?WBC 16.6*  ?NEUTROABS 10.2*  ?HGB 9.2*  ?HCT 30.4*  ?MCV 83.7  ?PLT 461*  ? ? ?HG/HCT   Down  from baseline see below ?   ?Component Value Date/Time  ? HGB 9.2 (L) 05/18/2021 1945  ? HCT 30.4 (L) 05/18/2021 1945  ? MCV 83.7 05/18/2021 1945  ? ?  ? ?Recent Labs  ?Lab 05/18/21 ?1945  ?LIPASE 52*  ? ?No results for input(s): AMMONIA in the last 168 hours. ?  ? ?Cardiac Panel (last 3 results) ?Recent Labs  ?  05/18/21 ?1945  ?CKTOTAL 106  ? ? .car   ?  ?Radiological Exams on Admission: ?No results found. ?_______________________________________________________________________________________________________ ?Latest   ?Blood pressure 121/73, pulse (!) 48, temperature (!) 97.4 ?F (36.3 ?C), temperature source Oral, resp. rate 12, SpO2 97 %. ? ? Vitals  labs and radiology finding personally reviewed  ?Review of Systems:  ? ? Pertinent positives include:   fatigue, constipation, presyncope ? ?Constitutional:  ?No weight loss, night sweats, Fevers, chills, weight loss  ?HEENT:  ?No headaches, Difficulty swallowing,Tooth/dental problems,Sore throat,  ?No sneezing, itching, ear ache, nasal congestion, post nasal drip,  ?Cardio-vascular:  ?No chest pain, Orthopnea, PND, anasarca, dizziness, palpitations.no Bilateral lower extremity swelling  ?GI:  ?No heartburn, indigestion, abdominal pain, nausea, vomiting, diarrhea, change in bowel habits, loss of appetite, melena, blood in stool, hematemesis ?Resp:  ?no shortness of breath at rest. No dyspnea on exertion, No excess mucus, no productive cough, No non-productive cough, No coughing up of blood.No change in color of mucus.No wheezing. ?Skin:  ?no rash or lesions. No jaundice ?GU:  ?no dysuria, change in color of urine, no urgency or frequency. No straining to urinate.  ?No flank pain.  ?Musculoskeletal:  ?No joint pain or no  joint swelling. No decreased range of motion. No back pain.  ?Psych:  ?No change in mood or affect. No depression or anxiety. No memory loss.  ?Neuro: no localizing neurological complaints, no tingling, no weakness, no double vision, no gait abnormali

## 2021-05-18 NOTE — ED Notes (Addendum)
This RN ans S. Lorenz Coaster, RN attempted x3 to cath pt for urine. Unable to obtain specimen d/t complex anatomy. Will notify provider of the same. ?

## 2021-05-18 NOTE — Assessment & Plan Note (Signed)
Follow CBC, [facility] is aware keep NPO post midnight, clear liquis diet for now ?Transfuse as needed for hG < 7 or worsening bleeding ?

## 2021-05-18 NOTE — Assessment & Plan Note (Signed)
Given hypotention will hold home meds ? ?

## 2021-05-18 NOTE — Assessment & Plan Note (Signed)
In the setting of worsening anemia, large stool ball  Abdominal cramping ?Cycle CE, obtain echo ?ECG unchaned from prior ?

## 2021-05-18 NOTE — Assessment & Plan Note (Signed)
Left more than right  ?Will check doppler ?

## 2021-05-19 ENCOUNTER — Observation Stay (HOSPITAL_COMMUNITY): Payer: Medicare Other

## 2021-05-19 ENCOUNTER — Encounter (HOSPITAL_COMMUNITY): Payer: Self-pay | Admitting: Internal Medicine

## 2021-05-19 DIAGNOSIS — D509 Iron deficiency anemia, unspecified: Secondary | ICD-10-CM | POA: Diagnosis not present

## 2021-05-19 DIAGNOSIS — I9589 Other hypotension: Secondary | ICD-10-CM | POA: Diagnosis present

## 2021-05-19 DIAGNOSIS — E538 Deficiency of other specified B group vitamins: Secondary | ICD-10-CM | POA: Diagnosis present

## 2021-05-19 DIAGNOSIS — R55 Syncope and collapse: Secondary | ICD-10-CM | POA: Diagnosis present

## 2021-05-19 DIAGNOSIS — Z20822 Contact with and (suspected) exposure to covid-19: Secondary | ICD-10-CM | POA: Diagnosis present

## 2021-05-19 DIAGNOSIS — I10 Essential (primary) hypertension: Secondary | ICD-10-CM | POA: Diagnosis present

## 2021-05-19 DIAGNOSIS — D649 Anemia, unspecified: Secondary | ICD-10-CM | POA: Diagnosis not present

## 2021-05-19 DIAGNOSIS — L89311 Pressure ulcer of right buttock, stage 1: Secondary | ICD-10-CM | POA: Diagnosis present

## 2021-05-19 DIAGNOSIS — Z515 Encounter for palliative care: Secondary | ICD-10-CM | POA: Diagnosis not present

## 2021-05-19 DIAGNOSIS — N179 Acute kidney failure, unspecified: Secondary | ICD-10-CM | POA: Diagnosis present

## 2021-05-19 DIAGNOSIS — R600 Localized edema: Secondary | ICD-10-CM | POA: Diagnosis not present

## 2021-05-19 DIAGNOSIS — R195 Other fecal abnormalities: Secondary | ICD-10-CM | POA: Diagnosis not present

## 2021-05-19 DIAGNOSIS — K5909 Other constipation: Secondary | ICD-10-CM | POA: Diagnosis present

## 2021-05-19 DIAGNOSIS — Z993 Dependence on wheelchair: Secondary | ICD-10-CM | POA: Diagnosis not present

## 2021-05-19 DIAGNOSIS — Z96642 Presence of left artificial hip joint: Secondary | ICD-10-CM | POA: Diagnosis present

## 2021-05-19 DIAGNOSIS — Z66 Do not resuscitate: Secondary | ICD-10-CM | POA: Diagnosis present

## 2021-05-19 DIAGNOSIS — L89322 Pressure ulcer of left buttock, stage 2: Secondary | ICD-10-CM | POA: Diagnosis present

## 2021-05-19 DIAGNOSIS — K529 Noninfective gastroenteritis and colitis, unspecified: Secondary | ICD-10-CM | POA: Diagnosis not present

## 2021-05-19 DIAGNOSIS — G629 Polyneuropathy, unspecified: Secondary | ICD-10-CM | POA: Diagnosis present

## 2021-05-19 DIAGNOSIS — Z79899 Other long term (current) drug therapy: Secondary | ICD-10-CM | POA: Diagnosis not present

## 2021-05-19 DIAGNOSIS — R609 Edema, unspecified: Secondary | ICD-10-CM

## 2021-05-19 DIAGNOSIS — K559 Vascular disorder of intestine, unspecified: Secondary | ICD-10-CM | POA: Diagnosis present

## 2021-05-19 DIAGNOSIS — Z7982 Long term (current) use of aspirin: Secondary | ICD-10-CM | POA: Diagnosis not present

## 2021-05-19 DIAGNOSIS — N6310 Unspecified lump in the right breast, unspecified quadrant: Secondary | ICD-10-CM | POA: Diagnosis present

## 2021-05-19 DIAGNOSIS — D62 Acute posthemorrhagic anemia: Secondary | ICD-10-CM | POA: Diagnosis present

## 2021-05-19 LAB — C DIFFICILE QUICK SCREEN W PCR REFLEX
C Diff antigen: NEGATIVE
C Diff interpretation: NOT DETECTED
C Diff toxin: NEGATIVE

## 2021-05-19 LAB — SODIUM, URINE, RANDOM: Sodium, Ur: 46 mmol/L

## 2021-05-19 LAB — CBC
HCT: 28.2 % — ABNORMAL LOW (ref 36.0–46.0)
HCT: 26.8 % — ABNORMAL LOW (ref 36.0–46.0)
HCT: 27.3 % — ABNORMAL LOW (ref 36.0–46.0)
Hemoglobin: 8.4 g/dL — ABNORMAL LOW (ref 12.0–15.0)
Hemoglobin: 8 g/dL — ABNORMAL LOW (ref 12.0–15.0)
Hemoglobin: 8.2 g/dL — ABNORMAL LOW (ref 12.0–15.0)
MCH: 24.9 pg — ABNORMAL LOW (ref 26.0–34.0)
MCH: 24.8 pg — ABNORMAL LOW (ref 26.0–34.0)
MCH: 24.8 pg — ABNORMAL LOW (ref 26.0–34.0)
MCHC: 29.8 g/dL — ABNORMAL LOW (ref 30.0–36.0)
MCHC: 29.9 g/dL — ABNORMAL LOW (ref 30.0–36.0)
MCHC: 30 g/dL (ref 30.0–36.0)
MCV: 83.4 fL (ref 80.0–100.0)
MCV: 83.2 fL (ref 80.0–100.0)
MCV: 82.7 fL (ref 80.0–100.0)
Platelets: 417 10*3/uL — ABNORMAL HIGH (ref 150–400)
Platelets: 421 10*3/uL — ABNORMAL HIGH (ref 150–400)
Platelets: 403 10*3/uL — ABNORMAL HIGH (ref 150–400)
RBC: 3.38 MIL/uL — ABNORMAL LOW (ref 3.87–5.11)
RBC: 3.22 MIL/uL — ABNORMAL LOW (ref 3.87–5.11)
RBC: 3.3 MIL/uL — ABNORMAL LOW (ref 3.87–5.11)
RDW: 16.2 % — ABNORMAL HIGH (ref 11.5–15.5)
RDW: 16.1 % — ABNORMAL HIGH (ref 11.5–15.5)
RDW: 16.3 % — ABNORMAL HIGH (ref 11.5–15.5)
WBC: 14.8 10*3/uL — ABNORMAL HIGH (ref 4.0–10.5)
WBC: 14 10*3/uL — ABNORMAL HIGH (ref 4.0–10.5)
WBC: 13.7 10*3/uL — ABNORMAL HIGH (ref 4.0–10.5)
nRBC: 0 % (ref 0.0–0.2)
nRBC: 0 % (ref 0.0–0.2)
nRBC: 0 % (ref 0.0–0.2)

## 2021-05-19 LAB — COMPREHENSIVE METABOLIC PANEL
ALT: 13 U/L (ref 0–44)
AST: 26 U/L (ref 15–41)
Albumin: 2.8 g/dL — ABNORMAL LOW (ref 3.5–5.0)
Alkaline Phosphatase: 59 U/L (ref 38–126)
Anion gap: 8 (ref 5–15)
BUN: 33 mg/dL — ABNORMAL HIGH (ref 8–23)
CO2: 24 mmol/L (ref 22–32)
Calcium: 8.7 mg/dL — ABNORMAL LOW (ref 8.9–10.3)
Chloride: 103 mmol/L (ref 98–111)
Creatinine, Ser: 1.41 mg/dL — ABNORMAL HIGH (ref 0.44–1.00)
GFR, Estimated: 34 mL/min — ABNORMAL LOW (ref >60)
Glucose, Bld: 131 mg/dL — ABNORMAL HIGH (ref 70–99)
Potassium: 4.7 mmol/L (ref 3.5–5.1)
Sodium: 135 mmol/L (ref 135–145)
Total Bilirubin: 0.5 mg/dL (ref 0.3–1.2)
Total Protein: 5.2 g/dL — ABNORMAL LOW (ref 6.5–8.1)

## 2021-05-19 LAB — TROPONIN I (HIGH SENSITIVITY)
Troponin I (High Sensitivity): 148 ng/L (ref <18)
Troponin I (High Sensitivity): 141 ng/L (ref <18)

## 2021-05-19 LAB — URINALYSIS, COMPLETE (UACMP) WITH MICROSCOPIC
Bilirubin Urine: NEGATIVE
Glucose, UA: NEGATIVE mg/dL
Hgb urine dipstick: NEGATIVE
Ketones, ur: 5 mg/dL — AB
Leukocytes,Ua: NEGATIVE
Nitrite: NEGATIVE
Protein, ur: NEGATIVE mg/dL
Specific Gravity, Urine: >1.046 — ABNORMAL HIGH (ref 1.005–1.030)
pH: 5 (ref 5.0–8.0)

## 2021-05-19 LAB — GASTROINTESTINAL PANEL BY PCR, STOOL (REPLACES STOOL CULTURE)

## 2021-05-19 LAB — ECHOCARDIOGRAM COMPLETE
AR max vel: 1.24 cm2
AV Area VTI: 1.15 cm2
AV Area mean vel: 1.38 cm2
AV Mean grad: 11.3 mmHg
AV Peak grad: 21.3 mmHg
Ao pk vel: 2.31 m/s
Area-P 1/2: 1.43 cm2
Height: 63 in
S' Lateral: 2.6 cm
Weight: 2000.01 oz

## 2021-05-19 LAB — OSMOLALITY, URINE: Osmolality, Ur: 419 mOsm/kg (ref 300–900)

## 2021-05-19 LAB — TSH: TSH: 9.254 u[IU]/mL — ABNORMAL HIGH (ref 0.350–4.500)

## 2021-05-19 LAB — PREALBUMIN: Prealbumin: 17.7 mg/dL — ABNORMAL LOW (ref 18–38)

## 2021-05-19 LAB — ABO/RH: ABO/RH(D): A NEG

## 2021-05-19 LAB — CREATININE, URINE, RANDOM: Creatinine, Urine: 57.85 mg/dL

## 2021-05-19 MED ORDER — SODIUM CHLORIDE 0.9 % IV SOLN
2.0000 g | INTRAVENOUS | Status: DC
  Administered 2021-05-19 – 2021-05-21 (×3): 2 g via INTRAVENOUS
  Filled 2021-05-19 (×3): qty 20

## 2021-05-19 MED ORDER — CIPROFLOXACIN IN D5W 400 MG/200ML IV SOLN
400.0000 mg | Freq: Once | INTRAVENOUS | Status: AC
  Administered 2021-05-19: 400 mg via INTRAVENOUS
  Filled 2021-05-19: qty 200

## 2021-05-19 MED ORDER — SODIUM CHLORIDE 0.9 % IV SOLN
75.0000 mL/h | INTRAVENOUS | Status: AC
  Administered 2021-05-19: 75 mL/h via INTRAVENOUS

## 2021-05-19 MED ORDER — SODIUM CHLORIDE 0.9 % IV SOLN
250.0000 mg | Freq: Once | INTRAVENOUS | Status: AC
  Administered 2021-05-19: 250 mg via INTRAVENOUS
  Filled 2021-05-19: qty 20

## 2021-05-19 MED ORDER — METRONIDAZOLE 500 MG/100ML IV SOLN
500.0000 mg | Freq: Three times a day (TID) | INTRAVENOUS | Status: DC
  Administered 2021-05-19 – 2021-05-21 (×8): 500 mg via INTRAVENOUS
  Filled 2021-05-19 (×8): qty 100

## 2021-05-19 MED ORDER — PANTOPRAZOLE SODIUM 40 MG PO TBEC
40.0000 mg | DELAYED_RELEASE_TABLET | Freq: Every day | ORAL | Status: DC
  Administered 2021-05-20 – 2021-05-23 (×3): 40 mg via ORAL
  Filled 2021-05-19 (×4): qty 1

## 2021-05-19 MED ORDER — CIPROFLOXACIN IN D5W 400 MG/200ML IV SOLN
400.0000 mg | INTRAVENOUS | Status: DC
Start: 2021-05-20 — End: 2021-05-19

## 2021-05-19 MED ORDER — CYANOCOBALAMIN 1000 MCG/ML IJ SOLN
1000.0000 ug | Freq: Once | INTRAMUSCULAR | Status: AC
  Administered 2021-05-19: 1000 ug via INTRAMUSCULAR
  Filled 2021-05-19: qty 1

## 2021-05-19 NOTE — Progress Notes (Signed)
?  Echocardiogram ?2D Echocardiogram has been performed. ? Caitlyn Braun ?05/19/2021, 12:33 PM ?

## 2021-05-19 NOTE — Evaluation (Signed)
Clinical/Bedside Swallow Evaluation ?Patient Details  ?Name: Caitlyn Braun ?MRN: [id number] ?Date of Birth: 1922-11-18 ? ?Today's Date: 05/19/2021 ?Time: SLP Start Time (ACUTE ONLY): 1137 SLP Stop Time (ACUTE ONLY): 1148 ?SLP Time Calculation (min) (ACUTE ONLY): 11 min ? ?Past Medical History:  ?Past Medical History:  ?Diagnosis Date  ? Hearing loss   ? Hypertension   ? ?Past Surgical History:  ?Past Surgical History:  ?Procedure Laterality Date  ? JOINT REPLACEMENT    ? ?HPI:  ?Caitlyn Braun is a 88 y.o. female presenting to [facility] ED 05/18/21 from her ALF after being found unconscious in her wheelchair. Once patient regained consciousness, c/o lower abdominal pain. Pt found to have soft BP and imaging (+) for mild bibasilar atelectasis and acute inflammation change consistent with colitis.  ?  ?Assessment / Plan / Recommendation  ?Clinical Impression ? Pt presents with normal swallowing function.  Oral mechanism exam unremarkable; wears upper dentures. Demonstrates thorough mastication, brisk swallow response, no s/s of aspiration.  No hx of dysphagia. Recomend continuing soft diet as ordered by GI; thin liquids; pillls whole in water.  No SLP f/u is needed. Our service will sign off. ?SLP Visit Diagnosis: Dysphagia, unspecified (R13.10) ?   ?Aspiration Risk ? No limitations  ?  ?Diet Recommendation   Per GI ? ?Medication Administration: Whole meds with liquid  ?  ?Other  Recommendations Oral Care Recommendations: Oral care BID   ? ?Recommendations for follow up therapy are one component of a multi-disciplinary discharge planning process, led by the attending physician.  Recommendations may be updated based on patient status, additional functional criteria and insurance authorization. ? ?Follow up Recommendations No SLP follow up  ? ? ?  ? ? ?Swallow Study   ?General Date of Onset: 05/19/21 ?HPI: Caitlyn Braun is a 88 y.o. female presenting to [facility] ED 05/18/21 from her ALF after being found unconscious in her  wheelchair. Once patient regained consciousness, c/o lower abdominal pain. Pt found to have soft BP and imaging (+) for mild bibasilar atelectasis and acute inflammation change consistent with colitis. ?Type of Study: Bedside Swallow Evaluation ?Previous Swallow Assessment: no ?Diet Prior to this Study: Dysphagia 3 (soft);Thin liquids ?Temperature Spikes Noted: No ?Respiratory Status: Room air ?History of Recent Intubation: No ?Behavior/Cognition: Alert;Cooperative;Pleasant mood ?Oral Cavity Assessment: Within Functional Limits ?Oral Care Completed by SLP: No ?Oral Cavity - Dentition: Dentures, top ?Vision: Functional for self-feeding ?Self-Feeding Abilities: Able to feed self ?Patient Positioning: Upright in bed ?Baseline Vocal Quality: Normal ?Volitional Cough: Strong  ?  ?Oral/Motor/Sensory Function Overall Oral Motor/Sensory Function: Within functional limits   ?Ice Chips Ice chips: Within functional limits   ?Thin Liquid Thin Liquid: Within functional limits  ?  ?Nectar Thick Nectar Thick Liquid: Not tested   ?Honey Thick Honey Thick Liquid: Not tested   ?Puree Puree: Within functional limits   ?Solid ? ? ?     ? ?  ? ?Caitlyn Braun ?05/19/2021,11:48 AM ? ?Caitlyn Braun L. Zhanna Melin, MA CCC/SLP ?Acute Rehabilitation Services ?Office number [phone] ?Pager [phone] ? ? ? ?

## 2021-05-19 NOTE — ED Notes (Signed)
Patient cleaned of small amount of incontinent light brown stool. Clean brief placed. Patients lights turned down per request. ?

## 2021-05-19 NOTE — Progress Notes (Signed)
Pharmacy Antibiotic Note ? ?Caitlyn Braun is a 88 y.o. female admitted on 05/18/2021 with  colitis .  Pharmacy has been consulted for ciprofloxacin dosing. ? ?Plan: ?Ciprofloxacin 400mg  IV Q24H. ?Flagyl ordered appropriately by MD. ? ?Temp (24hrs), Avg:97.4 ?F (36.3 ?C), Min:97.4 ?F (36.3 ?C), Max:97.4 ?F (36.3 ?C) ? ?Recent Labs  ?Lab 05/18/21 ?1945  ?WBC 16.6*  ?CREATININE 1.27*  ?  ?CrCl ~20 ml/min   ? ?No Known Allergies ? ? ?Thank you for allowing pharmacy to be a part of this patient?s care. ? ?05/20/21, PharmD, BCPS  ?05/19/2021 12:09 AM ? ?

## 2021-05-19 NOTE — Progress Notes (Signed)
BLE venous duplex has been completed. ? ? ?Results can be found under chart review under CV PROC. ?05/19/2021 1:03 PM ?Kamarius Buckbee RVT, RDMS ? ?

## 2021-05-19 NOTE — Progress Notes (Signed)
?PROGRESS NOTE ? ? ? ?Caitlyn Braun  ZOX:[id number] DOB: 11/13/1922 DOA: 05/18/2021 ?PCP: Patient, No Pcp Per (Inactive)  ? ? ?Brief Narrative:  ?88 year old with history of hypertension on lisinopril, constipation on MiraLAX from assisted living facility brought to the emergency room with an episode of unresponsiveness.  She uses wheelchair at baseline.  She was found in the wheelchair very slow to respond and having abdominal cramps.  EMS found her hypotensive. ? ? ?Assessment & Plan: ?  ?Postural dizziness and presyncopal episode likely related to abdominal cramping and hypotension ?Constipation followed by diarrhea with ongoing loose stool and evidence of diffuse colitis and CT scan along with evidence of blood in stool, likely ischemic colitis versus viral gastroenteritis. ?Anemia of acute blood loss, baseline hemoglobin 10-11, today 8. ?Acute kidney injury secondary to diarrhea. ? ?Orthostatic precautions.  Patient is wheelchair-bound.  Continue maintenance IV fluid today.  Blood pressures adequate today. ?For colitis, patient is on ciprofloxacin and Flagyl.  Will avoid ciprofloxacin in this 88 year old, will treat with Rocephin and Flagyl today.  Stool studies if able.  C. difficile negative.  GI pathogen panel pending. ?Hemoglobin 8.  Continue monitoring every 12 hours, transfuse if less than 7 or active bleeding.  Patient and her family has consented for blood transfusion if needed.  Recheck in the evening. ?Kidney injury is likely prerenal, continue IV fluids today. ?Discontinue lisinopril given AKI and hypotension. ? ?Breast mass: Reported by patient.  Do not want to be investigated.  Fair plan. ? ?Bilateral leg swelling, left more than right, extremity duplexes ordered.  Pending. ? ?Severe iron deficiency: We will provide 1 unit of IV iron while in the hospital. ? ?B12 deficiency: Significantly low B12.  1 unit of injectable B12 followed by oral B12. ? ? ?DVT prophylaxis: SCDs Start: 05/18/21  2328 ? ? ?Code Status: DNR ?Family Communication: Son on the phone ?Disposition Plan: Status is: Inpatient ?Remains inpatient appropriate because: Ongoing GI bleeding, diarrhea, IV fluid intervention, AKI. ?  ? ? ?Consultants:  ?Gastroenterology ? ?Procedures:  ?None ? ?Antimicrobials:  ?Ciprofloxacin 4/30 ?Rocephin 5/1-- ?Flagyl 4/30--- ? ? ?Subjective: ?Patient seen and examined.  Hard of hearing.  She tells me only thing she needs is to be cleaned up.  No other overnight events.  Denies any nausea vomiting.  Currently without any abdominal cramps. ? ?Objective: ?Vitals:  ? 05/19/21 0800 05/19/21 0830 05/19/21 0841 05/19/21 0945  ?BP: (!) 116/52 (!) 120/57  (!) 106/51  ?Pulse: 63 66    ?Resp: 13 14    ?Temp:      ?TempSrc:      ?SpO2: 100% 100%    ?Weight:   56.7 kg   ?Height:   5\' 3"  (1.6 m)   ? ? ?Intake/Output Summary (Last 24 hours) at 05/19/2021 1146 ?Last data filed at 05/19/2021 1013 ?Gross per 24 hour  ?Intake 1200 ml  ?Output 200 ml  ?Net 1000 ml  ? ?Filed Weights  ? 05/19/21 0841  ?Weight: 56.7 kg  ? ? ?Examination: ? ?General exam: Appears calm and comfortable  ?Slightly anxious and appropriate for acute illness.  On room air. ?Respiratory system: Clear to auscultation. Respiratory effort normal.  No added sounds. ?Cardiovascular system: S1 & S2 heard, RRR.  ?Gastrointestinal system: Abdomen is nondistended, soft and nontender. No organomegaly or masses felt. Normal bowel sounds heard. ?Central nervous system: Alert and oriented.  Diffusely weak.  No focal deficits. ? ? ? ? ?Data Reviewed: I have personally reviewed following labs and imaging studies ? ?  CBC: ?Recent Labs  ?Lab 05/18/21 ?1945 05/19/21 ?0230 05/19/21 ?1024  ?WBC 16.6* 14.8* 14.0*  ?NEUTROABS 10.2*  --   --   ?HGB 9.2* 8.4* 8.0*  ?HCT 30.4* 28.2* 26.8*  ?MCV 83.7 83.4 83.2  ?PLT 461* 417* 421*  ? ?Basic Metabolic Panel: ?Recent Labs  ?Lab 05/18/21 ?1945 05/19/21 ?0230  ?NA 137 135  ?K 4.3 4.7  ?CL 100 103  ?CO2 24 24  ?GLUCOSE 129* 131*  ?BUN  28* 33*  ?CREATININE 1.27* 1.41*  ?CALCIUM 9.4 8.7*  ?MG 2.3  --   ?PHOS 4.1  --   ? ?GFR: ?Estimated Creatinine Clearance: 18.9 mL/min (A) (by C-G formula based on SCr of 1.41 mg/dL (H)). ?Liver Function Tests: ?Recent Labs  ?Lab 05/18/21 ?1945 05/19/21 ?0230  ?AST 25  24 26   ?ALT 11  9 13   ?ALKPHOS 70  75 59  ?BILITOT 0.5  0.6 0.5  ?PROT 6.3*  6.2* 5.2*  ?ALBUMIN 3.3*  3.3* 2.8*  ? ?Recent Labs  ?Lab 05/18/21 ?1945  ?LIPASE 52*  ? ?No results for input(s): AMMONIA in the last 168 hours. ?Coagulation Profile: ?Recent Labs  ?Lab 05/18/21 ?1945  ?INR 1.0  ? ?Cardiac Enzymes: ?Recent Labs  ?Lab 05/18/21 ?1945  ?CKTOTAL 106  ? ?BNP (last 3 results) ?No results for input(s): PROBNP in the last 8760 hours. ?HbA1C: ?No results for input(s): HGBA1C in the last 72 hours. ?CBG: ?No results for input(s): GLUCAP in the last 168 hours. ?Lipid Profile: ?No results for input(s): CHOL, HDL, LDLCALC, TRIG, CHOLHDL, LDLDIRECT in the last 72 hours. ?Thyroid Function Tests: ?Recent Labs  ?  05/19/21 ?0230  ?TSH 9.254*  ? ?Anemia Panel: ?Recent Labs  ?  05/18/21 ?1945  ?07/19/21 128*  ?FOLATE 13.1  ?FERRITIN 7*  ?TIBC 433  ?IRON 22*  ?RETICCTPCT 1.2  ? ?Sepsis Labs: ?No results for input(s): PROCALCITON, LATICACIDVEN in the last 168 hours. ? ?Recent Results (from the past 240 hour(s))  ?C Difficile Quick Screen w PCR reflex     Status: None  ? Collection Time: 05/19/21  3:00 AM  ? Specimen: Stool  ?Result Value Ref Range Status  ? C Diff antigen NEGATIVE NEGATIVE Final  ? C Diff toxin NEGATIVE NEGATIVE Final  ? C Diff interpretation No C. difficile detected.  Final  ?  Comment: Performed at [facility], [address], [place], [address] [place]  ?  ? ? ? ? ? ?Radiology Studies: ?CT Abdomen Pelvis W Contrast ? ?Result Date: 05/18/2021 ?CLINICAL DATA:  Acute abdominal pain EXAM: CT ABDOMEN AND PELVIS WITH CONTRAST TECHNIQUE: Multidetector CT imaging of the abdomen and pelvis was performed using the standard protocol  following bolus administration of intravenous contrast. RADIATION DOSE REDUCTION: This exam was performed according to the departmental dose-optimization program which includes automated exposure control, adjustment of the mA and/or kV according to patient size and/or use of iterative reconstruction technique. CONTRAST:  99371 OMNIPAQUE IOHEXOL 300 MG/ML  SOLN COMPARISON:  None. FINDINGS: Lower chest: No acute abnormality. Hepatobiliary: Fatty infiltration of the liver is noted. The gallbladder is within normal limits. Pancreas: Unremarkable. No pancreatic ductal dilatation or surrounding inflammatory changes. Spleen: Normal in size without focal abnormality. Adrenals/Urinary Tract: Adrenal glands are within normal limits. Kidneys demonstrate a normal enhancement pattern bilaterally. Small 1 cm cyst is noted in the upper pole of the right kidney. No follow-up is recommended. The bladder is partially distended. Stomach/Bowel: Diverticular change of the colon is noted with diffuse wall thickening throughout  the rectosigmoid and descending colon consistent with colitis. No abscess or perforation is seen. No free air is noted. The more proximal colon shows fluid within although no inflammatory changes are seen. The appendix is not well visualized. No inflammatory changes to suggest appendicitis are seen. The small bowel is unremarkable. Moderate to large hiatal hernia is noted. Vascular/Lymphatic: Aortic atherosclerosis. No enlarged abdominal or pelvic lymph nodes. Reproductive: Uterus demonstrates some generalized decreased attenuation centrally of uncertain significance. Given the patient's age no further follow-up is recommended. Other: No abdominal wall hernia or abnormality. No abdominopelvic ascites. Musculoskeletal: Prior left hip replacement is noted. Degenerative changes of lumbar spine are seen. No acute compression fracture is seen. IMPRESSION: Changes of diverticulosis within the colon as well as diffuse  inflammatory change involving the descending, sigmoid and rectal walls consistent with colitis. No evidence of perforation or abscess formation is noted. Moderate to large hiatal hernia. Electronically Signed   By: Rhunette Croft

## 2021-05-19 NOTE — Consult Note (Signed)
? ?                                                                          [facility] Consult: ?10:17 AM ?05/19/2021 ? LOS: 0 days  ? ? ?Referring Provider: Dr Corrie Mckusick.    ?Primary Care Physician:  Patient, No Pcp Per (Inactive) ?Primary Gastroenterologist:  unassigned  ? ? ? ?Reason for Consultation:  FOBT Positive anemia.  Constipation. ?  ?HPI: Caitlyn Braun is a 88 y.o. female.  Prior left hip replacement.  Hearing loss.  Peripheral neuropathy. Hypertension.  Notes mention a "growth in her right breast" which patient declined investigation.  Patient says she had removal of fibrous tissue from her breast at age 76 and given her advanced age did not want further investigation of what ever is going on in the right breast, apparently referred out for possible biopsy, resection but there is nothing in the chart indicating investigation of breast abnormality. ?Not clear that patient has ever had a colonoscopy or EGD.  There is nothing in the right GERD to suggest this but most of her medical care was in [place]. ? ?Outpatient meds include as needed MiraLAX, no PPI. ? ?Patient lives at a care facility.  She was found slumped in her wheelchair, unresponsive.  By arrival of EMS she was slow to respond, hypotensive but became progressively more alert.  Intake notes mention her complaint of cramping in her lower abdomen with urge to defecate.  However patient denies abdominal pain.  Patient says that she been having problems with constipation but this had improved lately.  Denies any black or bloody stools. ?In the ER she was hard of hearing but alert and oriented x3. ?Patient passed a large ball of stool and then large amount of brown stool and then "severe diarrhea" in the ED.  No blood was observed.  However stool submitted to lab was FOBT positive.  C. difficile is negative, stool pathogen panel  processing. ? ?Blood pressures initially as low as 109/53, now in the 1 teens/50s.  No tachycardia or hypoxia.  No fever ?GFR is 34, no priors for comparison.  Sodium, LFTs normal.  However her albumin and prealbumin are both low.  High-sensitivity troponins went from 24 ..  148..  141. ?Leukos in the 130s. ?Hgb 9.2 ..  8.4.  MCV normal 83.  Iron is low 22 and ferritin is 7.  Iron sats low with normal TIBC.  B12 also low with normal folate..  Elevated TSH at 9.2, no free T4 assay yet. ? ?CTAP w contrast: Moderate to large HH.  Colonic diverticulosis.  Diffuse changes of inflammation of descending, sigmoid, rectum consistent with colitis. ? ?Moved to [place] to live at an assisted living facility about 3 years ago.  Previously lived in [place].  She has a son in [place].  2 other children are not local., 1 lives in [place]. ?  ? ?Past Medical History:  ?Diagnosis Date  ? Hearing loss   ? Hypertension   ? ? ?Past Surgical History:  ?Procedure Laterality Date  ? JOINT REPLACEMENT    ? ? ?Prior to Admission medications   ?Medication Sig Start Date End Date Taking? Authorizing Provider  ?acetaminophen (TYLENOL)  500 MG tablet Take 1,000 mg by mouth 2 (two) times daily as needed for moderate pain or headache.   Yes [provider]  ?aspirin EC 81 MG tablet Take 81 mg by mouth daily. Swallow whole.   Yes [provider]  ?Calcium Carb-Cholecalciferol (CALCIUM 600 + D PO) Take 1 capsule by mouth in the morning and at bedtime.   Yes [provider]  ?lisinopril (ZESTRIL) 10 MG tablet Take 10 mg by mouth daily.   Yes [provider]  ?polyethylene glycol powder (GLYCOLAX/MIRALAX) 17 GM/SCOOP powder Take 17 g by mouth daily as needed for mild constipation.   Yes [provider]  ?azithromycin (ZITHROMAX) 250 MG tablet Take 2 tablets on the first day, followed by 1 tablet the following 4 days. ?Patient not taking: Reported on 05/19/2021 09/02/19   Lenward ChancellorBloomfield, Carley D,  DO  ?lisinopril (ZESTRIL) 40 MG tablet Take 40 mg by mouth daily.  ?Patient not taking: Reported on 05/19/2021    [provider]  ? ? ?Scheduled Meds: ? pantoprazole (PROTONIX) IV  40 mg Intravenous QHS  ? ?Infusions: ? [START ON 05/20/2021] ciprofloxacin    ? metronidazole Stopped (05/19/21 1013)  ? ?PRN Meds: ?acetaminophen **OR** acetaminophen, HYDROcodone-acetaminophen ? ? ?Allergies as of 05/18/2021  ? (No Known Allergies)  ? ? ?History reviewed. No pertinent family history. ? ?Social History  ? ?Socioeconomic History  ? Marital status: Widowed  ?  Spouse name: Not on file  ? Number of children: Not on file  ? Years of education: Not on file  ? Highest education level: Not on file  ?Occupational History  ? Not on file  ?Tobacco Use  ? Smoking status: Never  ? Smokeless tobacco: Never  ?Substance and Sexual Activity  ? Alcohol use: Never  ? Drug use: Never  ? Sexual activity: Not on file  ?Other Topics Concern  ? Not on file  ?Social History Narrative  ? Not on file  ? ?Social Determinants of Health  ? ?Financial Resource Strain: Not on file  ?Food Insecurity: Not on file  ?Transportation Needs: Not on file  ?Physical Activity: Not on file  ?Stress: Not on file  ?Social Connections: Not on file  ?Intimate Partner Violence: Not on file  ? ? ?REVIEW OF SYSTEMS: ?Constitutional: Patient generally is wheelchair-bound though she can stand and walk a few steps if she is leaning on people or objects.  Denies weakness or fatigue. ?ENT: Extremely hard of hearing.  No nose bleeds ?Pulm: Denies shortness of breath and cough. ?CV:  No palpitations, no LE edema.  No chest pain ?GU:  No hematuria, no frequency ?GI: No heartburn, no nausea or vomiting.  Other details in the HPI. ?Heme: Denies unusual or excessive bleeding or bruising ?Transfusions: None ?Neuro:  No headaches, no peripheral tingling or numbness.  No syncope, no seizures. ?Derm:  No itching, no rash or sores.  ?Endocrine:  No sweats or chills.  No polyuria  or dysuria ?Immunization: Not queried. ?Travel:  None beyond local counties in last few months.  ? ? ?PHYSICAL EXAM: ?Vital signs in last 24 hours: ?Vitals:  ? 05/19/21 0800 05/19/21 0830  ?BP: (!) 116/52 (!) 120/57  ?Pulse: 63 66  ?Resp: 13 14  ?Temp:    ?SpO2: 100% 100%  ? ?Wt Readings from Last 3 Encounters:  ?05/19/21 56.7 kg  ?09/04/19 56.7 kg  ?09/02/19 56.7 kg  ? ? ?General: Pleasant, generally well appearing aged WF in no distress.  She is very  hard of hearing which makes communication a challenge. ?Head: No facial asymmetry or signs of head trauma. ?Eyes: Conjunctiva slightly pale.  No scleral icterus.  EOMI ?Ears: Very hard of hearing ?Nose: No discharge or congestion ?Mouth: Tongue lists towards the right.  Mucosa is pink, moist, clear.  Upper dentures.  Lower dentition fair. ?Neck: No JVD, no masses, no thyromegaly ?Lungs: Clear bilaterally.  No labored breathing or cough ?Heart: RRR.  No MRG.  S1, S2 present ?Abdomen: Soft without tenderness.  No guarding or rebound.  Active bowel sounds.  No HSM, hernias, bruits appreciated.Marland Kitchen   ?Rectal: Deferred ?Musc/Skeltl: No joint redness, swelling or gross deformities. ?Extremities: There is ringlike pitting just above the ankle from the elastic of socks.  There is swelling in the feet but no pitting of this swelling ?Neurologic: Appropriate.  Able to provide good history when she can hear and understand the questions.  Provided additional information about her past medical history not listed in the chart.  Follows commands.  Moves all 4 limbs, strength not tested.  No observed tremors or gross deficits.  She is oriented to place, self.  Initially said it was 2021 with a question on her face and asked me if this was correct.  I informed her that it was 2023.  She is not overtly confused or displaying signs of incompetence or advanced dementia. ?Skin: No rash, no sores, no suspicious lesions ?Tattoos: None observed ?Nodes: No cervical adenopathy ?Psych: Pleasant  affect, cooperative.  Calm ? ?Intake/Output from previous day: ?04/30 0701 - 05/01 0700 ?In: -  ?Out: 200 [Urine:200] ?Intake/Output this shift: ?Total I/O ?In: 1200 [I.V.:1000; IV Piggyback:200] ?Out: -  ? ?LAB RE

## 2021-05-19 NOTE — Evaluation (Signed)
Occupational Therapy Evaluation ?Patient Details ?Name: Caitlyn Braun ?MRN: [id number] ?DOB: March 06, 1922 ?Today's Date: 05/19/2021 ? ? ?History of Present Illness [name] is a 88 y.o. female presenting to [facility] ED 05/18/21 from her ALF after being found unconscious in her wheelchair. Once patient regained consciousness, c/o lower abdominal pain. Pt found to have soft BP and imaging (+) for mild bibasilar atelectasis and acute inflammation change consistent with colitis. PMH includes: HTN.  ? ?Clinical Impression ?  ?Patient admitted for above and presents with problem list below, including impaired balance, decreased activity tolerance, generalized weakness and B knee/L shoulder pain. Reports having assist from PCA at ALF for transfers, limited mobility using RW and ADLs. Currently requires total assist +2 for bed mobility, min assist +2 for transfers (from elevated stretcher in ED), and up to total assist for LB ADLs.  She will benefit from continued OT services while admitted and after dc at [facility] level as long as ALF can provide necessary level of assist.  Will follow acutely.  ?   ? ?Recommendations for follow up therapy are one component of a multi-disciplinary discharge planning process, led by the attending physician.  Recommendations may be updated based on patient status, additional functional criteria and insurance authorization.  ? ?Follow Up Recommendations ? Home health OT (if ALF can provide level of assist, otherwise will need SNF)  ?  ?Assistance Recommended at Discharge Frequent or constant Supervision/Assistance  ?Patient can return home with the following A lot of help with walking and/or transfers;A lot of help with bathing/dressing/bathroom;Direct supervision/assist for medications management;Assistance with cooking/housework;Direct supervision/assist for financial management;Assist for transportation;Help with stairs or ramp for entrance ? ?  ?Functional Status Assessment ? Patient has had a recent  decline in their functional status and demonstrates the ability to make significant improvements in function in a reasonable and predictable amount of time.  ?Equipment Recommendations ? BSC/3in1  ?  ?Recommendations for Other Services   ? ? ?  ?Precautions / Restrictions Precautions ?Precautions: Fall ?Restrictions ?Weight Bearing Restrictions: No  ? ?  ? ?Mobility Bed Mobility ?Overal bed mobility: Needs Assistance ?Bed Mobility: Supine to Sit, Sit to Supine ?  ?  ?Supine to sit: Total assist, +2 for physical assistance, +2 for safety/equipment, HOB elevated ?Sit to supine: Total assist, +2 for physical assistance, +2 for safety/equipment ?  ?General bed mobility comments: overall total assist for LB and trunk support. ?  ? ?Transfers ?  ?  ?  ?  ?  ?  ?  ?  ?  ?  ?  ? ?  ?Balance Overall balance assessment: Needs assistance ?Sitting-balance support: No upper extremity supported, Feet supported, Single extremity supported ?Sitting balance-Leahy Scale: Fair ?Sitting balance - Comments: min guard with preference to UE support ?  ?Standing balance support: Bilateral upper extremity supported, During functional activity ?Standing balance-Leahy Scale: Poor ?Standing balance comment: relies on external support ?  ?  ?  ?  ?  ?  ?  ?  ?  ?  ?  ?   ? ?ADL either performed or assessed with clinical judgement  ? ?ADL Overall ADL's : Needs assistance/impaired ?  ?  ?Grooming: Minimal assistance;Sitting ?  ?  ?  ?  ?  ?Upper Body Dressing : Minimal assistance;Sitting ?  ?Lower Body Dressing: Total assistance;+2 for physical assistance;+2 for safety/equipment;Sit to/from stand ?  ?  ?Toilet Transfer Details (indicate cue type and reason): deferred, sit to stand min assist +2 ?  ?  ?  ?  ?  Functional mobility during ADLs: Minimal assistance;+2 for physical assistance;+2 for safety/equipment (from elevated stretcher) ?   ? ? ? ?Vision   ?Vision Assessment?: No apparent visual deficits  ?   ?Perception   ?  ?Praxis   ?   ? ?Pertinent Vitals/Pain Pain Assessment ?Pain Assessment: Faces ?Faces Pain Scale: Hurts little more ?Pain Location: bilateral knees ?Pain Descriptors / Indicators: Aching, Discomfort, Grimacing ?Pain Intervention(s): Limited activity within patient's tolerance, Monitored during session, Repositioned  ? ? ? ?Hand Dominance Right ?  ?Extremity/Trunk Assessment Upper Extremity Assessment ?Upper Extremity Assessment: Generalized weakness (reports decreased L shoulder ROM (AROM to 90*)) ?  ?Lower Extremity Assessment ?Lower Extremity Assessment: Defer to PT evaluation ?RLE Deficits / Details: R knee circumference appears larger than left knee. Pt able to lift R leg off of ED stretcher without difficulty. ?LLE Deficits / Details: Pt states L leg shorter than the R. No notable differences noted except LLE weakness and L knee circumference smaller than R. Pt unable to lift LLE off of ED stretcher and  L knee buckling in standing. ?  ?Cervical / Trunk Assessment ?Cervical / Trunk Assessment: Kyphotic ?  ?Communication Communication ?Communication: HOH ?  ?Cognition Arousal/Alertness: Awake/alert ?Behavior During Therapy: Anxious ?Overall Cognitive Status: No family/caregiver present to determine baseline cognitive functioning ?  ?  ?  ?  ?  ?  ?  ?  ?  ?  ?  ?  ?  ?  ?  ?  ?General Comments: oriented and follows commands, slow processing and HOH. ?  ?  ?General Comments  VSS on RA ? ?  ?Exercises   ?  ?Shoulder Instructions    ? ? ?Home Living Family/patient expects to be discharged to:: Assisted living ?Living Arrangements: Alone ?Available Help at Discharge: Personal care attendant;Available PRN/intermittently ?Type of Home: Assisted living ?  ?  ?  ?  ?  ?  ?  ?  ?  ?  ?  ?Home Equipment: Agricultural consultantolling Walker (2 wheels);Wheelchair - manual ?  ?Additional Comments: Pt reports PCAs come at night to help her transfer to bed. ?  ? ?  ?Prior Functioning/Environment Prior Level of Function : Needs assist ?  ?  ?  ?Physical Assist  : Mobility (physical);ADLs (physical) ?Mobility (physical): Bed mobility;Transfers ?ADLs (physical): Bathing;Dressing;IADLs ?Mobility Comments: use RW occasionally to stand up with assist; mostly wheelchair bound. Requires assist to transfer to wheelchair ?ADLs Comments: PCAs assist with dressing and bathing her back side. She feeds herself when wheeled to dining room. ?  ? ?  ?  ?OT Problem List: Decreased strength;Decreased activity tolerance;Impaired balance (sitting and/or standing);Decreased knowledge of use of DME or AE;Decreased knowledge of precautions;Pain ?  ?   ?OT Treatment/Interventions: Self-care/ADL training;Therapeutic exercise;DME and/or AE instruction;Therapeutic activities;Patient/family education;Balance training  ?  ?OT Goals(Current goals can be found in the care plan section) Acute Rehab OT Goals ?Patient Stated Goal: none stated ?Time For Goal Achievement: 06/02/21 ?Potential to Achieve Goals: Good  ?OT Frequency: Min 2X/week ?  ? ?Co-evaluation PT/OT/SLP Co-Evaluation/Treatment: Yes ?Reason for Co-Treatment: For patient/therapist safety;To address functional/ADL transfers ?PT goals addressed during session: Mobility/safety with mobility;Balance;Strengthening/ROM ?OT goals addressed during session: ADL's and self-care ?  ? ?  ?AM-PAC OT "6 Clicks" Daily Activity     ?Outcome Measure Help from another person eating meals?: A Little ?Help from another person taking care of personal grooming?: A Little ?Help from another person toileting, which includes using toliet, bedpan, or urinal?: Total ?Help from another person  bathing (including washing, rinsing, drying)?: A Lot ?Help from another person to put on and taking off regular upper body clothing?: A Little ?Help from another person to put on and taking off regular lower body clothing?: Total ?6 Click Score: 13 ?  ?End of Session Equipment Utilized During Treatment: Gait belt ?Nurse Communication: Mobility status ? ?Activity Tolerance: Patient  tolerated treatment well ?Patient left: in bed;with call bell/phone within reach ? ?OT Visit Diagnosis: Other abnormalities of gait and mobility (R26.89);Muscle weakness (generalized) (M62.81);Pain ?Pain - Right/Lef

## 2021-05-19 NOTE — Assessment & Plan Note (Signed)
CT scan showing significant colitis cause yet un cleared, ordered stool studies ?Start on antibiotic coverage given hypotension and elevated WBC  ?

## 2021-05-19 NOTE — Evaluation (Signed)
Physical Therapy Evaluation ?Patient Details ?Name: Caitlyn Braun ?MRN: [id number] ?DOB: 1922/03/20 ?Today's Date: 05/19/2021 ? ?History of Present Illness ? Desra is a 88 y.o. female presenting to [facility] ED 05/18/21 from her ALF after being found unconscious in her wheelchair. Once patient regained consciousness, c/o lower abdominal pain. Pt found to have soft BP and imaging (+) for mild bibasilar atelectasis and acute inflammation change consistent with colitis.  ?Clinical Impression ? Patient presented to the ED on 4/30 with soft BP and lower abdominal pain after being found unconscious in her wheelchair at ALF. Pt's impairments include decreased range of motion, strength, balance, and activity tolerance that are limiting her ability to safely and independently transfer and perform bed mobility. Patient requires total Ax2 for bed mobility and min Ax2 for transfers using 2 person HHA. Pt with notable left knee buckling in standing requiring PT to block for support. Pt fatigued quickly in standing c/o of bilateral knee pain. VSS during assessment. RN notified. SPT recommending Home Health PT upon D/C due to mobility deficits as long as ALF can provide needed level of assist. If not, pt may benefit from SNF for rehab. PT will continue to follow acutely to maximize pt's safety and independence with functional mobility tasks. ?   ?   ? ?Recommendations for follow up therapy are one component of a multi-disciplinary discharge planning process, led by the attending physician.  Recommendations may be updated based on patient status, additional functional criteria and insurance authorization. ? ?Follow Up Recommendations Home health PT (if ALF cannot provide needed level of assist, SNF may be more appropriate for rehab.) ? ?  ?Assistance Recommended at Discharge Frequent or constant Supervision/Assistance  ?Patient can return home with the following ? Two people to help with walking and/or transfers;A lot of help with  bathing/dressing/bathroom;Assistance with cooking/housework;Direct supervision/assist for medications management;Direct supervision/assist for financial management;Assist for transportation;Help with stairs or ramp for entrance ? ?  ?Equipment Recommendations Other (comment) (TBD by next venue of care)  ?Recommendations for Other Services ?    ?  ?Functional Status Assessment Patient has had a recent decline in their functional status and demonstrates the ability to make significant improvements in function in a reasonable and predictable amount of time.  ? ?  ?Precautions / Restrictions Precautions ?Precautions: Fall ?Restrictions ?Weight Bearing Restrictions: No  ? ?  ? ?Mobility ? Bed Mobility ?Overal bed mobility: Needs Assistance ?Bed Mobility: Supine to Sit, Sit to Supine ?  ?  ?Supine to sit: Total assist, +2 for physical assistance, +2 for safety/equipment, HOB elevated ?Sit to supine: Total assist, +2 for physical assistance, +2 for safety/equipment ?  ?General bed mobility comments: Pt BP stable in supine and sitting. Pt requiring total Ax2 for trunk and LE management for bed mobiltiy tasks. ?  ? ?Transfers ?Overall transfer level: Needs assistance ?Equipment used: 2 person hand held assist ?Transfers: Sit to/from Stand ?Sit to Stand: Min assist, +2 physical assistance, +2 safety/equipment, From elevated surface ?  ?  ?  ?  ?  ?General transfer comment: required min Ax2 for steadying and power up to stand with 2 person HHA. Pt fatiguing quickly with notable L knee buckling; SPT blocked L knee in standing. Pt stated pain in her knees, L>R. Pt stood with feet together, unable to weightshift to widen base of support. Further mobility deferred for pt and therapist safety. ?  ? ?Ambulation/Gait ?  ?  ?  ?  ?  ?  ?  ?General Gait Details:  Ambulation deferred for pt and therapist safety. ? ?Stairs ?  ?  ?  ?  ?  ? ?Wheelchair Mobility ?  ? ?Modified Rankin (Stroke Patients Only) ?  ? ?  ? ?Balance Overall balance  assessment: Needs assistance ?Sitting-balance support: Single extremity supported, Feet unsupported ?Sitting balance-Leahy Scale: Fair ?Sitting balance - Comments: required min guard A in sitting while she used her L UE to support herself in sitting. Pt unable to shfit her weight without external support (i.e. scooting hips to EOB). ?  ?Standing balance support: Bilateral upper extremity supported, During functional activity ?Standing balance-Leahy Scale: Poor ?Standing balance comment: requires min Ax2 for steadying with L knee blocked due to buckling. ?  ?  ?  ?  ?  ?  ?  ?  ?  ?  ?  ?   ? ? ? ?Pertinent Vitals/Pain Pain Assessment ?Pain Assessment: Faces ?Faces Pain Scale: Hurts little more ?Pain Location: bilateral knees ?Pain Descriptors / Indicators: Aching, Discomfort, Grimacing ?Pain Intervention(s): Monitored during session  ? ? ?Home Living Family/patient expects to be discharged to:: Assisted living ?Living Arrangements: Alone ?Available Help at Discharge: Personal care attendant;Available PRN/intermittently ?Type of Home: Assisted living ?  ?  ?  ?  ?  ?Home Equipment: Conservation officer, nature (2 wheels);Wheelchair - manual ?Additional Comments: Pt reports PCAs come at night to help her transfer to bed.  ?  ?Prior Function Prior Level of Function : Needs assist ?  ?  ?  ?Physical Assist : Mobility (physical);ADLs (physical) ?Mobility (physical): Bed mobility;Transfers ?ADLs (physical): Bathing;Dressing;IADLs ?Mobility Comments: use RW occasionally to stand up with assist; mostly wheelchair bound. Requires assist to transfer to wheelchair ?ADLs Comments: PCAs assist with dressing and bathing her back side. She feeds herself when wheeled to dining room. ?  ? ? ?Hand Dominance  ? Dominant Hand: Right ? ?  ?Extremity/Trunk Assessment  ? Upper Extremity Assessment ?Upper Extremity Assessment: Defer to OT evaluation ?  ? ?Lower Extremity Assessment ?Lower Extremity Assessment: LLE deficits/detail;Generalized  weakness;RLE deficits/detail ?RLE Deficits / Details: R knee circumference appears larger than left knee. Pt able to lift R leg off of ED stretcher without difficulty. ?LLE Deficits / Details: Pt states L leg shorter than the R. No notable differences noted except LLE weakness and L knee circumference smaller than R. Pt unable to lift LLE off of ED stretcher and  L knee buckling in standing. ?  ? ?Cervical / Trunk Assessment ?Cervical / Trunk Assessment: Kyphotic  ?Communication  ? Communication: HOH  ?Cognition Arousal/Alertness: Awake/alert ?Behavior During Therapy: Anxious ?Overall Cognitive Status: No family/caregiver present to determine baseline cognitive functioning ?  ?  ?  ?  ?  ?  ?  ?  ?  ?  ?  ?  ?  ?  ?  ?  ?General Comments: oriented x4 with slow processing and word finding difficulty. ?  ?  ? ?  ?General Comments General comments (skin integrity, edema, etc.): VSS. Small superficial scrape noted on L shin. ? ?  ?Exercises    ? ?Assessment/Plan  ?  ?PT Assessment Patient needs continued PT services  ?PT Problem List Decreased strength;Decreased range of motion;Decreased activity tolerance;Decreased balance;Decreased mobility;Decreased cognition;Pain ? ?   ?  ?PT Treatment Interventions DME instruction;Gait training;Functional mobility training;Therapeutic activities;Therapeutic exercise;Balance training;Patient/family education;Cognitive remediation   ? ?PT Goals (Current goals can be found in the Care Plan section)  ?Acute Rehab PT Goals ?Patient Stated Goal: to return to ALF ?PT Goal Formulation:  With patient ?Time For Goal Achievement: 06/02/21 ?Potential to Achieve Goals: Good ? ?  ?Frequency Min 3X/week ?  ? ? ?Co-evaluation PT/OT/SLP Co-Evaluation/Treatment: Yes ?Reason for Co-Treatment: For patient/therapist safety;To address functional/ADL transfers ?PT goals addressed during session: Mobility/safety with mobility;Balance;Strengthening/ROM ?  ?  ? ? ?  ?AM-PAC PT "6 Clicks" Mobility  ?Outcome  Measure Help needed turning from your back to your side while in a flat bed without using bedrails?: A Lot ?Help needed moving from lying on your back to sitting on the side of a flat bed without using bedrails?: Total ?

## 2021-05-20 DIAGNOSIS — L8990 Pressure ulcer of unspecified site, unspecified stage: Secondary | ICD-10-CM | POA: Insufficient documentation

## 2021-05-20 DIAGNOSIS — D649 Anemia, unspecified: Secondary | ICD-10-CM | POA: Diagnosis not present

## 2021-05-20 LAB — CBC
HCT: 23 % — ABNORMAL LOW (ref 36.0–46.0)
Hemoglobin: 7 g/dL — ABNORMAL LOW (ref 12.0–15.0)
MCH: 24.6 pg — ABNORMAL LOW (ref 26.0–34.0)
MCHC: 30.4 g/dL (ref 30.0–36.0)
MCV: 81 fL (ref 80.0–100.0)
Platelets: 368 10*3/uL (ref 150–400)
RBC: 2.84 MIL/uL — ABNORMAL LOW (ref 3.87–5.11)
RDW: 16.3 % — ABNORMAL HIGH (ref 11.5–15.5)
WBC: 11.9 10*3/uL — ABNORMAL HIGH (ref 4.0–10.5)
nRBC: 0 % (ref 0.0–0.2)

## 2021-05-20 LAB — BASIC METABOLIC PANEL
Anion gap: 5 (ref 5–15)
BUN: 40 mg/dL — ABNORMAL HIGH (ref 8–23)
CO2: 23 mmol/L (ref 22–32)
Calcium: 8.4 mg/dL — ABNORMAL LOW (ref 8.9–10.3)
Chloride: 109 mmol/L (ref 98–111)
Creatinine, Ser: 1.54 mg/dL — ABNORMAL HIGH (ref 0.44–1.00)
GFR, Estimated: 31 mL/min — ABNORMAL LOW (ref >60)
Glucose, Bld: 104 mg/dL — ABNORMAL HIGH (ref 70–99)
Potassium: 4.6 mmol/L (ref 3.5–5.1)
Sodium: 137 mmol/L (ref 135–145)

## 2021-05-20 LAB — PHOSPHORUS: Phosphorus: 4 mg/dL (ref 2.5–4.6)

## 2021-05-20 LAB — PREPARE RBC (CROSSMATCH)

## 2021-05-20 LAB — HEMOGLOBIN AND HEMATOCRIT, BLOOD
HCT: 26.3 % — ABNORMAL LOW (ref 36.0–46.0)
Hemoglobin: 8.4 g/dL — ABNORMAL LOW (ref 12.0–15.0)

## 2021-05-20 LAB — MAGNESIUM: Magnesium: 1.8 mg/dL (ref 1.7–2.4)

## 2021-05-20 MED ORDER — SODIUM CHLORIDE 0.9% IV SOLUTION: Freq: Once | INTRAVENOUS | Status: AC

## 2021-05-20 MED ORDER — ADULT MULTIVITAMIN W/MINERALS CH
1.0000 | ORAL_TABLET | Freq: Every day | ORAL | Status: DC
  Administered 2021-05-20 – 2021-05-21 (×2): 1 via ORAL
  Filled 2021-05-20 (×2): qty 1

## 2021-05-20 MED ORDER — ENSURE ENLIVE PO LIQD
237.0000 mL | Freq: Two times a day (BID) | ORAL | Status: DC
  Administered 2021-05-20 – 2021-05-21 (×2): 237 mL via ORAL

## 2021-05-20 MED ORDER — SODIUM CHLORIDE 0.9 % IV SOLN
250.0000 mg | Freq: Once | INTRAVENOUS | Status: AC
  Administered 2021-05-20: 250 mg via INTRAVENOUS
  Filled 2021-05-20: qty 20

## 2021-05-20 MED ORDER — CYANOCOBALAMIN 1000 MCG/ML IJ SOLN
1000.0000 ug | Freq: Every day | INTRAMUSCULAR | Status: AC
  Administered 2021-05-20 – 2021-05-21 (×2): 1000 ug via INTRAMUSCULAR
  Filled 2021-05-20 (×2): qty 1

## 2021-05-20 NOTE — Progress Notes (Signed)
?PROGRESS NOTE ? ? ? ?Maddi Collar  DUK:[id number] DOB: [birth year] DOA: 05/18/2021 ?PCP: Patient, No Pcp Per (Inactive)  ? ? ?Brief Narrative:  ?88 year old with history of hypertension on lisinopril, constipation on MiraLAX from assisted living facility brought to the emergency room with an episode of unresponsiveness.  She uses wheelchair at baseline.  She was found in the wheelchair very slow to respond and having abdominal cramps.  EMS found her hypotensive with GI bleeding.  ? ? ?Assessment & Plan: ?  ?Postural dizziness and presyncopal episode likely related to abdominal cramping and hypotension and lower GI bleeding. ?Anemia of acute blood loss: ?Constipation followed by diarrhea with ongoing loose bloody stool and evidence of diffuse colitis and CT scan along with evidence of blood in stool, likely ischemic colitis . ?Anemia of acute blood loss, baseline hemoglobin 10-11, today 8-7. ?Acute kidney injury secondary to diarrhea. ? ?Blood pressures are adequate.  Continue maintenance IV fluids today. ?1 unit of PRBC today, consented for blood transfusion.  C. difficile and GI pathogen panel negative.  This is likely ischemic colitis.  Patient is not endoscopy candidate.  Seen by GI, however will not suggest any procedure. ?Continue Rocephin and Flagyl. ?Discontinue lisinopril altogether with risk of AKI and hypotension. ?See goal of care discussion below. ? ?Breast mass: Reported by patient.  Do not want to be investigated.  Fair plan. ? ?Bilateral leg swelling, left more than right, extremity duplexes negative for DVT.  Probably dependent edema.  Currently unable to tolerate diuretics.  She was prescribed diuretics before and she did not take it. ? ?Severe iron deficiency: We will provide second unit of IV iron today. ? ?B12 deficiency: Significantly low B12. injectable B12 x3 days while in the hospital.  Patient will not take any medications.   ? ?Goal of care discussion: ?Patient was very frailty and  debility.  Patient herself tells me that she is waiting for God to take her and does not want to be forced to eat or take medications.  She was okay with some blood transfusion and fluid. ?I called and discussed with patient's son on the phone, suggested that we cannot do reasonable things to stabilize her with aim to go back to [facility] assisted living facility.  Since patient does not want to come to the hospital again, will request outpatient palliative or hospice follow-up.  If she had further events, she may peacefully want to die at the assisted living facility. ? ?DVT prophylaxis: SCDs Start: 05/18/21 2328 ? ? ?Code Status: DNR ?Family Communication: Son on the phone ?Disposition Plan: Status is: Inpatient ?Remains inpatient appropriate because: Ongoing GI bleeding, diarrhea, IV fluid intervention, AKI. ?  ? ? ?Consultants:  ?Gastroenterology ? ?Procedures:  ?None ? ?Antimicrobials:  ?Ciprofloxacin 4/30 ?Rocephin 5/1-- ?Flagyl 4/30--- ? ? ?Subjective: ?Patient seen and examined.  She was very upset about full tray of meals that went to waste basket.  Does not have much appetite. ?3 loose bowel movements overnight with one of them bloody bowel movement.  Denies any abdomen pain or cramping.  Denies any nausea vomiting. ?Patient tells me that she does not have any desire to continue medications. ? ?Objective: ?Vitals:  ? 05/20/21 0741 05/20/21 0946 05/20/21 1004 05/20/21 1240  ?BP: (!) 120/48 (!) 118/49 (!) 117/52 127/60  ?Pulse: 72 79 81 72  ?Resp: 17 16 16 16   ?Temp: 98.4 ?F (36.9 ?C) 98.9 ?F (37.2 ?C) 98.7 ?F (37.1 ?C) 98.3 ?F (36.8 ?C)  ?TempSrc:  Oral Oral Oral  ?  SpO2: 98%  95% 97%  ?Weight:      ?Height:      ? ? ?Intake/Output Summary (Last 24 hours) at 05/20/2021 1402 ?Last data filed at 05/20/2021 1240 ?Gross per 24 hour  ?Intake 1909.71 ml  ?Output --  ?Net 1909.71 ml  ? ?Filed Weights  ? 05/19/21 0841  ?Weight: 56.7 kg  ? ? ?Examination: ? ?General exam: Appears calm and comfortable, slightly anxious.   Frail and debilitated. ?Respiratory system: Clear to auscultation. Respiratory effort normal.  No added sounds. ?Cardiovascular system: S1 & S2 heard, RRR.  ?Gastrointestinal system: Abdomen is nondistended, soft and nontender. No organomegaly or masses felt. Normal bowel sounds heard. ?Central nervous system: Alert and oriented.  Diffusely weak.  No focal deficits. ?Anasarca. ? ? ? ? ?Data Reviewed: I have personally reviewed following labs and imaging studies ? ?CBC: ?Recent Labs  ?Lab 05/18/21 ?1945 05/19/21 ?0230 05/19/21 ?1024 05/19/21 ?1649 05/20/21 ?[id number]  ?WBC 16.6* 14.8* 14.0* 13.7* 11.9*  ?NEUTROABS 10.2*  --   --   --   --   ?HGB 9.2* 8.4* 8.0* 8.2* 7.0*  ?HCT 30.4* 28.2* 26.8* 27.3* 23.0*  ?MCV 83.7 83.4 83.2 82.7 81.0  ?PLT 461* 417* 421* 403* 368  ? ?Basic Metabolic Panel: ?Recent Labs  ?Lab 05/18/21 ?1945 05/19/21 ?0230 05/20/21 ?[id number]  ?NA 137 135 137  ?K 4.3 4.7 4.6  ?CL 100 103 109  ?CO2 24 24 23   ?GLUCOSE 129* 131* 104*  ?BUN 28* 33* 40*  ?CREATININE 1.27* 1.41* 1.54*  ?CALCIUM 9.4 8.7* 8.4*  ?MG 2.3  --  1.8  ?PHOS 4.1  --  4.0  ? ?GFR: ?Estimated Creatinine Clearance: 17.3 mL/min (A) (by C-G formula based on SCr of 1.54 mg/dL (H)). ?Liver Function Tests: ?Recent Labs  ?Lab 05/18/21 ?1945 05/19/21 ?0230  ?AST 25  24 26   ?ALT 11  9 13   ?ALKPHOS 70  75 59  ?BILITOT 0.5  0.6 0.5  ?PROT 6.3*  6.2* 5.2*  ?ALBUMIN 3.3*  3.3* 2.8*  ? ?Recent Labs  ?Lab 05/18/21 ?1945  ?LIPASE 52*  ? ?No results for input(s): AMMONIA in the last 168 hours. ?Coagulation Profile: ?Recent Labs  ?Lab 05/18/21 ?1945  ?INR 1.0  ? ?Cardiac Enzymes: ?Recent Labs  ?Lab 05/18/21 ?1945  ?CKTOTAL 106  ? ?BNP (last 3 results) ?No results for input(s): PROBNP in the last 8760 hours. ?HbA1C: ?No results for input(s): HGBA1C in the last 72 hours. ?CBG: ?No results for input(s): GLUCAP in the last 168 hours. ?Lipid Profile: ?No results for input(s): CHOL, HDL, LDLCALC, TRIG, CHOLHDL, LDLDIRECT in the last 72 hours. ?Thyroid Function  Tests: ?Recent Labs  ?  05/19/21 ?0230  ?TSH 9.254*  ? ?Anemia Panel: ?Recent Labs  ?  05/18/21 ?1945  ?[id number] 128*  ?FOLATE 13.1  ?FERRITIN 7*  ?TIBC 433  ?IRON 22*  ?RETICCTPCT 1.2  ? ?Sepsis Labs: ?No results for input(s): PROCALCITON, LATICACIDVEN in the last 168 hours. ? ?Recent Results (from the past 240 hour(s))  ?Gastrointestinal Panel by PCR , Stool     Status: None  ? Collection Time: 05/19/21  3:00 AM  ? Specimen: Stool  ?Result Value Ref Range Status  ? Campylobacter species NOT DETECTED NOT DETECTED Final  ? Plesimonas shigelloides NOT DETECTED NOT DETECTED Final  ? Salmonella species NOT DETECTED NOT DETECTED Final  ? Yersinia enterocolitica NOT DETECTED NOT DETECTED Final  ? Vibrio species NOT DETECTED NOT DETECTED Final  ? Vibrio cholerae NOT DETECTED NOT DETECTED Final  ?  Enteroaggregative E coli (EAEC) NOT DETECTED NOT DETECTED Final  ? Enteropathogenic E coli (EPEC) NOT DETECTED NOT DETECTED Final  ? Enterotoxigenic E coli (ETEC) NOT DETECTED NOT DETECTED Final  ? Shiga like toxin producing E coli (STEC) NOT DETECTED NOT DETECTED Final  ? Shigella/Enteroinvasive E coli (EIEC) NOT DETECTED NOT DETECTED Final  ? Cryptosporidium NOT DETECTED NOT DETECTED Final  ? Cyclospora cayetanensis NOT DETECTED NOT DETECTED Final  ? Entamoeba histolytica NOT DETECTED NOT DETECTED Final  ? Giardia lamblia NOT DETECTED NOT DETECTED Final  ? Adenovirus F40/41 NOT DETECTED NOT DETECTED Final  ? Astrovirus NOT DETECTED NOT DETECTED Final  ? Norovirus GI/GII NOT DETECTED NOT DETECTED Final  ? Rotavirus A NOT DETECTED NOT DETECTED Final  ? Sapovirus (I, II, IV, and V) NOT DETECTED NOT DETECTED Final  ?  Comment: Performed at [facility], [address], [place], [place] [address]  ?C Difficile Quick Screen w PCR reflex     Status: None  ? Collection Time: 05/19/21  3:00 AM  ? Specimen: Stool  ?Result Value Ref Range Status  ? C Diff antigen NEGATIVE NEGATIVE Final  ? C Diff toxin NEGATIVE NEGATIVE Final   ? C Diff interpretation No C. difficile detected.  Final  ?  Comment: Performed at [facility], [address] [place], [place] [address]  ?  ? ? ? ? ? ?Radiology Studies: ?CT Abdomen Pelvis W Contrast ?

## 2021-05-20 NOTE — Progress Notes (Signed)
Initial Nutrition Assessment ? ?DOCUMENTATION CODES:  ?Not applicable ? ?INTERVENTION:  ?Adjust diet to DYS 3 for easier consumption of foods ?Ensure Enlive po BID, each supplement provides 350 kcal and 20 grams of protein. ?MVI with minerals daily ? ?NUTRITION DIAGNOSIS:  ?Inadequate oral intake related to poor appetite as evidenced by meal completion < 50%. ? ?GOAL:  ?Patient will meet greater than or equal to 90% of their needs ? ?MONITOR:  ?PO intake, Supplement acceptance, Weight trends ? ?REASON FOR ASSESSMENT:  ?Consult ?Assessment of nutrition requirement/status ? ?ASSESSMENT:  ?Pt with hx of HTN presented to ED from her nursing facility after being found unresponsive with hypotension.  ? ?Pt resting in bed at the time of visit. Very hard of hearing, did not answer nutrition questions but did say her bottom was very sore from sitting up in the bed. Reclined the head of the bed for pt to take some pressure off of sacral area. Noticed that blood transfusion was leaking onto bed and gown, notified RN to assess.  ? ?Breakfast tray at bedside, poorly consumed. RN reports that pt ate what she wanted, but amount was small. Pt would likely do better with a DYS 3 diet, will adjust and add supplements.   ? ?Average Meal Intake: ?5/2: 50% intake x 1 recorded meals ? ?Nutritionally Relevant Medications: ?Scheduled Meds: ? cyanocobalamin  1,000 mcg Intramuscular Daily  ? pantoprazole  40 mg Oral Daily  ? ?Continuous Infusions: ? cefTRIAXone (ROCEPHIN)  IV 2 g (05/20/21 1248)  ? ferric gluconate (FERRLECIT) IVPB    ? metronidazole 500 mg (05/20/21 0801)  ? ?Labs Reviewed: ?BUN 40, creatinine 1.54 ? ?NUTRITION - FOCUSED PHYSICAL EXAM: ?Deficits consistent with normal aging process ?Flowsheet Row Most Recent Value  ?Orbital Region No depletion  ?Upper Arm Region No depletion  ?Thoracic and Lumbar Region No depletion  ?Buccal Region No depletion  ?Temple Region No depletion  ?Clavicle Bone Region No depletion  ?Clavicle and  Acromion Bone Region Mild depletion  ?Scapular Bone Region No depletion  ?Dorsal Hand Mild depletion  ?Patellar Region No depletion  ?Anterior Thigh Region No depletion  ?Posterior Calf Region No depletion  ?Edema (RD Assessment) None  ?Hair Reviewed  ?Eyes Reviewed  ?Mouth Reviewed  ?Skin Reviewed  ?Nails Reviewed  ? ?Diet Order:   ?Diet Order   ? ?       ?  Diet regular Room service appropriate? Yes; Fluid consistency: Thin  Diet effective now       ?  ? ?  ?  ? ?  ? ?EDUCATION NEEDS:  ?No education needs have been identified at this time ? ?Skin:  Skin Assessment: Reviewed RN Assessment (Stage 2 to the left buttocks, stage 1 to the right buttocks) ? ?Last BM:  5/2 - type 6 ? ?Height:  ?Ht Readings from Last 1 Encounters:  ?05/19/21 5\' 3"  (1.6 m)  ? ? ?Weight:  ?Wt Readings from Last 1 Encounters:  ?05/19/21 56.7 kg  ? ? ?Ideal Body Weight:  52.3 kg ? ?BMI:  Body mass index is 22.14 kg/m?. ? ?Estimated Nutritional Needs:  ?Kcal:  1400-1600 kcal/d ?Protein:  70-80 g/d ?Fluid:  1.5-1.8 L/d ? ? ?07/19/21, RD, LDN ?Clinical Dietitian ?RD pager # available in AMION  ?After hours/weekend pager # available in AMION ?

## 2021-05-21 DIAGNOSIS — E538 Deficiency of other specified B group vitamins: Secondary | ICD-10-CM | POA: Diagnosis not present

## 2021-05-21 DIAGNOSIS — N179 Acute kidney failure, unspecified: Secondary | ICD-10-CM | POA: Diagnosis not present

## 2021-05-21 DIAGNOSIS — Z7189 Other specified counseling: Secondary | ICD-10-CM

## 2021-05-21 DIAGNOSIS — D649 Anemia, unspecified: Secondary | ICD-10-CM | POA: Diagnosis not present

## 2021-05-21 LAB — BASIC METABOLIC PANEL
Anion gap: 6 (ref 5–15)
BUN: 33 mg/dL — ABNORMAL HIGH (ref 8–23)
CO2: 23 mmol/L (ref 22–32)
Calcium: 8.6 mg/dL — ABNORMAL LOW (ref 8.9–10.3)
Chloride: 109 mmol/L (ref 98–111)
Creatinine, Ser: 1.63 mg/dL — ABNORMAL HIGH (ref 0.44–1.00)
GFR, Estimated: 29 mL/min — ABNORMAL LOW (ref >60)
Glucose, Bld: 95 mg/dL (ref 70–99)
Potassium: 4.1 mmol/L (ref 3.5–5.1)
Sodium: 138 mmol/L (ref 135–145)

## 2021-05-21 LAB — CBC
HCT: 26.3 % — ABNORMAL LOW (ref 36.0–46.0)
Hemoglobin: 8.3 g/dL — ABNORMAL LOW (ref 12.0–15.0)
MCH: 25.7 pg — ABNORMAL LOW (ref 26.0–34.0)
MCHC: 31.6 g/dL (ref 30.0–36.0)
MCV: 81.4 fL (ref 80.0–100.0)
Platelets: 374 10*3/uL (ref 150–400)
RBC: 3.23 MIL/uL — ABNORMAL LOW (ref 3.87–5.11)
RDW: 15.7 % — ABNORMAL HIGH (ref 11.5–15.5)
WBC: 12.3 10*3/uL — ABNORMAL HIGH (ref 4.0–10.5)
nRBC: 0.2 % (ref 0.0–0.2)

## 2021-05-21 LAB — BPAM RBC
Blood Product Expiration Date: 202305102359
ISSUE DATE / TIME: 202305020938
Unit Type and Rh: 600

## 2021-05-21 LAB — TYPE AND SCREEN
ABO/RH(D): A NEG
Antibody Screen: NEGATIVE
Unit division: 0

## 2021-05-21 MED ORDER — POLYVINYL ALCOHOL 1.4 % OP SOLN
1.0000 [drp] | Freq: Four times a day (QID) | OPHTHALMIC | Status: DC | PRN
  Filled 2021-05-21: qty 15

## 2021-05-21 MED ORDER — BIOTENE DRY MOUTH MT LIQD: 15.0000 mL | OROMUCOSAL | Status: DC | PRN

## 2021-05-21 MED ORDER — ONDANSETRON HCL 4 MG/2ML IJ SOLN: 4.0000 mg | Freq: Four times a day (QID) | INTRAMUSCULAR | Status: DC | PRN

## 2021-05-21 MED ORDER — ONDANSETRON 4 MG PO TBDP: 4.0000 mg | ORAL_TABLET | Freq: Four times a day (QID) | ORAL | Status: DC | PRN

## 2021-05-21 NOTE — Progress Notes (Signed)
? ? ? [facility] ?                                                                            ? ? ?Caitlyn Braun, is a 88 y.o. female, DOB - 05/28/1922, ZOX:[id number] ?Admit date - 05/18/2021    ?Outpatient Primary MD for the patient is Patient, No Pcp Per (Inactive) ? ?LOS - 2  days ? ? ? ?Brief summary  ? ? ?88 year old with history of hypertension on lisinopril, constipation on MiraLAX from assisted living facility brought to the emergency room with an episode of unresponsiveness.  She uses wheelchair at baseline.  She was found in the wheelchair very slow to respond and having abdominal cramps.  EMS found her hypotensive with GI bleeding.  ? ?Assessment & Plan  ? ? ?Assessment and Plan: ? ? ?Postural dizziness and presyncopal episode likely secondary to hypotension with lower GI bleed ?S/p 1 unit of PRBC transfusion.  Infectious work-up is negative. ?Anemia of blood loss ?CT of the abdomen shows diffuse colitis possibly ischemic colitis. ?Patient does not want any further work-up done at this time. ?GI consulted not a candidate for any kind of procedure at this time. ?Continue with antibiotics empirically. ? ? ? ?Severe iron deficiency ?S/p IV iron infusion. ? ? ? ?Bilateral lower extremity edema probably secondary to dependent edema. ? ? ?Vitamin B12 deficiency ?S/p IM injections. ? ? ? ?Patient deconditioned and debilitated at this time with with decline progressively in the last few weeks to months.  After palliative care discussions patient requested we transition to comfort measures at this time without any procedures, she would not want to come back to the hospital again.  Further discussions about disposition scheduled for tomorrow. ?  ? ? ?RN Pressure Injury Documentation: ?Pressure Injury 05/19/21 Buttocks Left Stage 2 -  Partial thickness loss of dermis presenting as a shallow open injury with a red, pink wound bed without slough. Open wound on Left buttocks (Active)  ?05/19/21 1600   ?Location: Buttocks  ?Location Orientation: Left  ?Staging: Stage 2 -  Partial thickness loss of dermis presenting as a shallow open injury with a red, pink wound bed without slough.  ?Wound Description (Comments): Open wound on Left buttocks  ?Present on Admission: Yes  ?Dressing Type Foam - Lift dressing to assess site every shift 05/21/21 1000  ?   ?Pressure Injury 05/19/21 Buttocks Right Stage 1 -  Intact skin with non-blanchable redness of a localized area usually over a bony prominence. Red area on buttocks (Active)  ?05/19/21 1600  ?Location: Buttocks  ?Location Orientation: Right  ?Staging: Stage 1 -  Intact skin with non-blanchable redness of a localized area usually over a bony prominence.  ?Wound Description (Comments): Red area on buttocks  ?Present on Admission: Yes  ?Dressing Type Foam - Lift dressing to assess site every shift 05/21/21 1000  ? ? ?Malnutrition Type: ? ?Nutrition Problem: Inadequate oral intake ?Etiology: poor appetite ? ? ?Malnutrition Characteristics: ? ?Signs/Symptoms: meal completion < 50% ? ? ?Nutrition Interventions: ? ?Interventions: Refer to RD note for recommendations ? ?Estimated body mass index is 22.14 kg/m? as calculated from the following: ?  Height as of this encounter: 5\' 3"  (  1.6 m). ?  Weight as of this encounter: 56.7 kg. ? ?Code Status: DNR ?DVT Prophylaxis:   ? ? ?Level of Care: Level of care: Med-Surg ?Family Communication: none at bedside ? ?Disposition Plan:     Remains inpatient appropriate: Plan for palliative care meeting tomorrow regarding disposition. ? ?Procedures:  ?None  ? ?Consultants:   ?Palliative care ? ?Antimicrobials:  ? ?Anti-infectives (From admission, onward)  ? ? Start     Dose/Rate Route Frequency Ordered Stop  ? 05/20/21 0000  ciprofloxacin (CIPRO) IVPB 400 mg  Status:  Discontinued       ? 400 mg ?200 mL/hr over 60 Minutes Intravenous Every 24 hours 05/19/21 0009 05/19/21 1129  ? 05/19/21 1130  cefTRIAXone (ROCEPHIN) 2 g in sodium chloride  0.9 % 100 mL IVPB  Status:  Discontinued       ? 2 g ?200 mL/hr over 30 Minutes Intravenous Every 24 hours 05/19/21 1129 05/21/21 1237  ? 05/19/21 0015  metroNIDAZOLE (FLAGYL) IVPB 500 mg  Status:  Discontinued       ? 500 mg ?100 mL/hr over 60 Minutes Intravenous Every 8 hours 05/19/21 0001 05/21/21 1237  ? 05/19/21 0015  ciprofloxacin (CIPRO) IVPB 400 mg       ? 400 mg ?200 mL/hr over 60 Minutes Intravenous  Once 05/19/21 0009 05/19/21 0211  ? ?  ? ? ? ?Medications ? ?Scheduled Meds: ? pantoprazole  40 mg Oral Daily  ? ?Continuous Infusions: ?PRN Meds:.acetaminophen **OR** acetaminophen, antiseptic oral rinse, HYDROcodone-acetaminophen, ondansetron **OR** ondansetron (ZOFRAN) IV, polyvinyl alcohol ? ? ? ?Subjective:  ? ?Caitlyn Braun was seen and examined today. No new complaints.  ? ?Objective:  ? ?Vitals:  ? 05/20/21 1541 05/20/21 1939 05/21/21 0640 05/21/21 1608  ?BP: (!) 144/58 (!) 139/56 (!) 160/64 (!) 158/66  ?Pulse: 69 74 71 74  ?Resp: 18 17 17 16   ?Temp: 98.2 ?F (36.8 ?C) 98.2 ?F (36.8 ?C) 98 ?F (36.7 ?C) 98.2 ?F (36.8 ?C)  ?TempSrc:  Oral Oral Oral  ?SpO2: 100% 99% 95% 99%  ?Weight:      ?Height:      ? ? ?Intake/Output Summary (Last 24 hours) at 05/21/2021 1650 ?Last data filed at 05/21/2021 07/21/2021 ?Gross per 24 hour  ?Intake 670 ml  ?Output --  ?Net 670 ml  ? ?Filed Weights  ? 05/19/21 0841  ?Weight: 56.7 kg  ? ? ? ?Exam ?General exam: Appears calm and comfortable  ?Respiratory system: Clear to auscultation. Respiratory effort normal. ?Cardiovascular system: S1 & S2 heard, RRR. No JVD, No pedal edema. ?Gastrointestinal system: Abdomen is nondistended, soft and nontender. Normal bowel sounds heard. ?Central nervous system: Alert ,  very hard of hearing.  ?Extremities: Symmetric 5 x 5 power. ?Skin: No rashes, lesions or ulcers ?Psychiatry: mood is appropriate.  ? ? ?Data Reviewed:  I have personally reviewed following labs and imaging studies ? ? ?CBC ?Lab Results  ?Component Value Date  ? WBC 12.3 (H)  05/21/2021  ? RBC 3.23 (L) 05/21/2021  ? HGB 8.3 (L) 05/21/2021  ? HCT 26.3 (L) 05/21/2021  ? MCV 81.4 05/21/2021  ? MCH 25.7 (L) 05/21/2021  ? PLT 374 05/21/2021  ? MCHC 31.6 05/21/2021  ? RDW 15.7 (H) 05/21/2021  ? LYMPHSABS 5.3 (H) 05/18/2021  ? MONOABS 0.6 05/18/2021  ? EOSABS 0.4 05/18/2021  ? BASOSABS 0.0 05/18/2021  ? ? ? ?Last metabolic panel ?Lab Results  ?Component Value Date  ? NA 138 05/21/2021  ? K 4.1 05/21/2021  ?  CL 109 05/21/2021  ? CO2 23 05/21/2021  ? BUN 33 (H) 05/21/2021  ? CREATININE 1.63 (H) 05/21/2021  ? GLUCOSE 95 05/21/2021  ? GFRNONAA 29 (L) 05/21/2021  ? GFRAA 59 (L) 09/04/2019  ? CALCIUM 8.6 (L) 05/21/2021  ? PHOS 4.0 05/20/2021  ? PROT 5.2 (L) 05/19/2021  ? ALBUMIN 2.8 (L) 05/19/2021  ? BILITOT 0.5 05/19/2021  ? ALKPHOS 59 05/19/2021  ? AST 26 05/19/2021  ? ALT 13 05/19/2021  ? ANIONGAP 6 05/21/2021  ? ? ?CBG (last 3)  ?No results for input(s): GLUCAP in the last 72 hours.  ? ? ?Coagulation Profile: ?Recent Labs  ?Lab 05/18/21 ?1945  ?INR 1.0  ? ? ? ?Radiology Studies: ?No results found. ? ? ? ? ?Kathlen Mody M.D. ?[facility] ?05/21/2021, 4:50 PM ? ?Available via Epic secure chat 7am-7pm ?After 7 pm, please refer to night coverage provider listed on amion. ? ? ? ?

## 2021-05-21 NOTE — Progress Notes (Signed)
Occupational Therapy Treatment ?Patient Details ?Name: Caitlyn Braun ?MRN: [id number] ?DOB: 1922-09-08 ?Today's Date: 05/21/2021 ? ? ?History of present illness Benedetta is a 88 y.o. female presenting to [facility] ED 05/18/21 from her ALF after being found unconscious in her wheelchair. Once patient regained consciousness, c/o lower abdominal pain. Pt found to have soft BP and imaging (+) for mild bibasilar atelectasis and acute inflammation change consistent with colitis. PMH includes: HTN. ?  ?OT comments ? Pt making steady progress towards OT goals this session. Pt continues to present with decreased activity tolerance, increased pain ( bilateral knees), impaired balance, and generalized deconditioning. Pt required increased encouragement to participate in session stating "whats the use?" often during session. Provided education on maintaining strength for potential DC back to ALF. Pt noted to be wet in bed and finally agreeable to use stedy for sit>stand to complete pericare. Pt needed MAX A for all aspects of bed mobility mostly d/t impaired participation  during mobility tasks. MAX A to power into standing from EOB and from seat of stedy. Pt currently requires MAX A for all LB ADLS. Pt would continue to benefit from skilled occupational therapy while admitted and after d/c to address the below listed limitations in order to improve overall functional mobility and facilitate independence with BADL participation. DC plan remains appropriate, will follow acutely per POC.  ? ?  ? ?Recommendations for follow up therapy are one component of a multi-disciplinary discharge planning process, led by the attending physician.  Recommendations may be updated based on patient status, additional functional criteria and insurance authorization. ?   ?Follow Up Recommendations ? Home health OT (if ALF can provide level of assist, otherwise will need SNF)  ?  ?Assistance Recommended at Discharge Frequent or constant Supervision/Assistance   ?Patient can return home with the following ? A lot of help with walking and/or transfers;A lot of help with bathing/dressing/bathroom;Direct supervision/assist for medications management;Assistance with cooking/housework;Direct supervision/assist for financial management;Assist for transportation;Help with stairs or ramp for entrance ?  ?Equipment Recommendations ? BSC/3in1  ?  ?Recommendations for Other Services   ? ?  ?Precautions / Restrictions Precautions ?Precautions: Fall ?Restrictions ?Weight Bearing Restrictions: No  ? ? ?  ? ?Mobility Bed Mobility ?  ?Bed Mobility: Supine to Sit ?  ?  ?Supine to sit: Max assist, HOB elevated ?  ?  ?General bed mobility comments: MAX A for all aspects of bed mobility, mostly d/t decreased initiation/participation ?  ? ?Transfers ?Overall transfer level: Needs assistance ?Equipment used: Ambulation equipment used (stedy) ?Transfers: Sit to/from Stand ?Sit to Stand: Mod assist ?  ?  ?  ?  ?  ?General transfer comment: heavy MOD to rise from EOB with stedy, total A placement of BLEs on foot plate of stedy as well as hand placement on bar, pt needed assist to power up into standing from EOB and from seat of stedy ?  ?  ?Balance Overall balance assessment: Needs assistance ?Sitting-balance support: No upper extremity supported, Feet supported ?Sitting balance-Leahy Scale: Poor ?Sitting balance - Comments: at least [facility]  for static sitting balance from EOB ?  ?Standing balance support: Bilateral upper extremity supported, During functional activity ?Standing balance-Leahy Scale: Poor ?Standing balance comment: relies on external support ?  ?  ?  ?  ?  ?  ?  ?  ?  ?  ?  ?   ? ?ADL either performed or assessed with clinical judgement  ? ?ADL Overall ADL's : Needs assistance/impaired ?  ?  ?  ?  ?  ?  ?  Lower Body Bathing: Maximal assistance;Sit to/from stand ?Lower Body Bathing Details (indicate cue type and reason): simulated via pericare from stedy ?  ?  ?Lower Body Dressing:  Maximal assistance;Sit to/from stand ?Lower Body Dressing Details (indicate cue type and reason): simualted via pericare in standing in stedy ?Toilet Transfer: Total assistance ?Toilet Transfer Details (indicate cue type and reason): stedy transfer ?Toileting- Clothing Manipulation and Hygiene: Maximal assistance;Sit to/from stand ?Toileting - Clothing Manipulation Details (indicate cue type and reason): fro pericare in standing in stedy ?  ?  ?Functional mobility during ADLs: Moderate assistance (sit>stand in stedy) ?General ADL Comments: pt continues to present with decreased activity tolerance, generalized deconditioning, imparied strength/endurance ?  ? ?Extremity/Trunk Assessment Upper Extremity Assessment ?Upper Extremity Assessment: Generalized weakness ?  ?Lower Extremity Assessment ?Lower Extremity Assessment: Defer to PT evaluation ?  ?Cervical / Trunk Assessment ?Cervical / Trunk Assessment: Kyphotic ?  ? ?Vision Baseline Vision/History:  (no visual deficits observed during gross assessment) ?  ?  ?Perception Perception ?Perception: Within Functional Limits ?  ?Praxis Praxis ?Praxis: Impaired ?Praxis Impairment Details: Initiation;Motor planning ?  ? ?Cognition Arousal/Alertness: Awake/alert ?Behavior During Therapy: Anxious, Agitated (mildy agitated) ?Overall Cognitive Status: No family/caregiver present to determine baseline cognitive functioning ?  ?  ?  ?  ?  ?  ?  ?  ?  ?  ?  ?  ?  ?  ?  ?  ?General Comments: insistent that "there was nothing that could be done to help her" needing increased time to process, resistant to all mobily tasks stating that she has two bad kness and can't be helped. ?  ?  ?   ?Exercises   ? ?  ?Shoulder Instructions   ? ? ?  ?General Comments incontinent BM during session, set- up pt for lunch from bed level  ? ? ?Pertinent Vitals/ Pain       Pain Assessment ?Pain Assessment: Faces ?Faces Pain Scale: Hurts little more ?Pain Location: bilateral knees ?Pain Descriptors /  Indicators: Aching, Discomfort, Grimacing, Constant ?Pain Intervention(s): Limited activity within patient's tolerance, Monitored during session, Repositioned ? ?Home Living   ?  ?  ?  ?  ?  ?  ?  ?  ?  ?  ?  ?  ?  ?  ?  ?  ?  ?  ? ?  ?Prior Functioning/Environment    ?  ?  ?  ?   ? ?Frequency ? Min 2X/week  ? ? ? ? ?  ?Progress Toward Goals ? ?OT Goals(current goals can now be found in the care plan section) ? Progress towards OT goals: Progressing toward goals ? ?Acute Rehab OT Goals ?Time For Goal Achievement: 06/02/21 ?Potential to Achieve Goals: Fair  ?Plan Discharge plan remains appropriate;Frequency remains appropriate   ? ?Co-evaluation ? ? ?   ?  ?  ?  ?  ? ?  ?AM-PAC OT "6 Clicks" Daily Activity     ?Outcome Measure ? ? Help from another person eating meals?: A Little ?Help from another person taking care of personal grooming?: A Little ?Help from another person toileting, which includes using toliet, bedpan, or urinal?: Total ?Help from another person bathing (including washing, rinsing, drying)?: Total ?Help from another person to put on and taking off regular upper body clothing?: A Lot ?Help from another person to put on and taking off regular lower body clothing?: Total ?6 Click Score: 11 ? ?  ?End of Session Equipment Utilized During Treatment: Gait belt;Other (  comment) (stedy) ? ?OT Visit Diagnosis: Other abnormalities of gait and mobility (R26.89);Muscle weakness (generalized) (M62.81);Pain ?Pain - Right/Left:  (bilateral) ?Pain - part of body: Knee ?  ?Activity Tolerance Patient limited by fatigue;Other (comment) (decreased participation) ?  ?Patient Left in bed;with call bell/phone within reach;with bed alarm set;Other (comment) (set- up for lunch) ?  ?Nurse Communication Mobility status ?  ? ?   ? ?Time: [id number] ?OT Time Calculation (min): 26 min ? ?Charges: OT General Charges ?$OT Visit: 1 Visit ?OT Treatments ?$Self Care/Home Management : 23-37 mins ? ?Pollyann GlenMary Kate K., COTA/L ?Acute  Rehabilitation Services ?[phone] ? ? ?Barron SchmidMary Kate Izell Labat ?05/21/2021, 2:09 PM ?

## 2021-05-21 NOTE — Consult Note (Signed)
? ?                                                                                ?Consultation Note ?Date: 05/21/2021  ? ?Patient Name: Caitlyn Braun  ?DOB: November 03, 1922  MRN: [id number]  Age / Sex: 88 y.o., female  ?PCP: Patient, No Pcp Per (Inactive) ?Referring Physician: Hosie Poisson, MD ? ?Reason for Consultation: Establishing goals of care ? ?HPI/Patient Profile: 88 y.o. female with past medical history of HTN, HOH admitted on 05/18/2021 with presyncope, symptomatic anemia, AKI, ischemic colitis. ? ?Patient presents from ALF and is not a candidate for endoscopy, EGD. PMT has been consulted to assist with goals of care conversation. ? ?Clinical Assessment and Goals of Care: ? ?I have reviewed medical records including EPIC notes, labs and imaging, received report from RN, assessed the patient and met at the bedside then called patient's son Simona Huh to discuss diagnosis prognosis, [place], EOL wishes, disposition and options. ? ?I introduced Palliative Medicine as specialized medical care for people living with serious illness. It focuses on providing relief from the symptoms and stress of a serious illness. The goal is to improve quality of life for both the patient and the family. ? ?We discussed a brief life review of the patient and then focused on their current illness. The natural disease trajectory and expectations at EOL were discussed. ? ?I attempted to elicit values and goals of care important to the patient.   ? ?Medical History Review and Understanding: ?Discussed current illness and care plan with patient including diarrhea, AKI, colitis, anemia.  ? 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 She lives at [place] and tells me she is often excluded from activities that require mobility. Her son Simona Huh lives locally and she also has a son in [place] and a daughter in [place].  ? ?Functional and Nutritional State: ?Patient is fiercely independent and  laments being dependent on others for assistance with transfers and ADLs. She reports spending all of her time in her wheelchair or bed. She does not have much of an appetite.   ? ?Code Status: ?Concepts specific to code status, artifical feeding and hydration, and rehospitalization were considered and discussed. ? ?Discussion: ?Patient describes her quality of life as "nothing." She did not realize that interventions such as IV antibiotics, IV fluids, blood transfusions were considered life-prolonging interventions. We discussed rationale and she verbalized her understanding. Patient prefers to die a natural death as soon as the [facility] is ready to take her. She does not want to prolong the dying process. She feels that life is no longer meaningful when she can't take care of herself and is dependent on others. She states multiple times "I'm 88 years old, I accept that I can't do for myself and there is nothing more that can be done about it." I recommended consideration of comfort care and hospice given her goals of care. Patient shares that her sister was enrolled in hospice after a shoulder fracture and fall. Her sister died within 5 days and this has contributed to her concern about enrolling in hospice herself. Provided reassurance and education that hospice does  not hasten the dying process, but rather provides expert EOL care and assistance depending on her specific needs. Counseled on the importance of additional support and symptom management as she will continue to decline without medical interventions. Patient states "I don't want to get involved with any agencies, [facility] Means knows what I want done." A MOST form was introduced and extensive conversation was had, covering concepts specific to code status, artifical feeding and hydration, continued IV antibiotics and rehospitalization. She is also hesitant to complete any documentation and solely wants to return home whenever able. She is agreeable to this PA  calling her son. ? ?Provided patient's son with update on our conversation. Patient's son shares he is in agreement with comfort care and appropriateness of hospice given patient's goals of care and feelings on her current illness. He has had a hard time encouraging patient to eat, take her medications, generally participate in her care. He shares that she has always been "stubborn" and will not be swayed to do anything she does not want to do. He plans to update patient's other two children and would be agreeable to patient returning to ALF to focus on comfort with or without hospice, as he may request hospice support later on.      ? ? ?The difference between aggressive medical intervention and comfort care was considered in light of the patient's goals of care. Hospice and Palliative Care services outpatient were explained and offered.  ? ?Discussed the importance of continued conversation with family and the medical providers regarding overall plan of care and treatment options, ensuring decisions are within the context of the patient?s values and GOCs.  ? ?Questions and concerns were addressed. The family was encouraged to call with questions or concerns.  PMT will continue to support holistically.  ? ?  ? ?SUMMARY OF RECOMMENDATIONS   ?-DNR ?-Transition to full comfort care, patient does not want any life-prolonging interventions and son is supportive of her wishes ?-Patient is declining hospice support, prefers to allow natural disease process to continue without hospice referral or rehospitalization ?-MOST form introduced today ?-Discussed with patient's son who is supportive, tentative family meeting tomorrow around 5-6pm to continue [place] discussion  ?-PMT will continue to follow ? ?Prognosis:  ?Unable to determine ? ?Discharge Planning: To Be Determined  ? ?  ? ?Primary Diagnoses: ?Present on Admission: ? Symptomatic anemia ? Constipation ? Essential hypertension ? AKI (acute kidney injury) ([place]) ? Breast  mass, right ? Bilateral leg edema ? Abdominal pain ? Colitis ? Syncope and collapse ? ? ?I have reviewed the medical record, interviewed the patient and family, and examined the patient. The following aspects are pertinent. ? ?Past Medical History:  ?Diagnosis Date  ? Hearing loss   ? Hypertension   ? ?Social History  ? ?Socioeconomic History  ? Marital status: Widowed  ?  Spouse name: Not on file  ? Number of children: Not on file  ? Years of education: Not on file  ? Highest education level: Not on file  ?Occupational History  ? Not on file  ?Tobacco Use  ? Smoking status: Never  ? Smokeless tobacco: Never  ?Substance and Sexual Activity  ? Alcohol use: Never  ? Drug use: Never  ? Sexual activity: Not on file  ?Other Topics Concern  ? Not on file  ?Social History Narrative  ? Not on file  ? ?Social Determinants of Health  ? ?Financial Resource Strain: Not on file  ?Food Insecurity: Not on file  ?  Transportation Needs: Not on file  ?Physical Activity: Not on file  ?Stress: Not on file  ?Social Connections: Not on file  ? ?History reviewed. No pertinent family history. ?Scheduled Meds: ? feeding supplement  237 mL Oral BID BM  ? multivitamin with minerals  1 tablet Oral Daily  ? pantoprazole  40 mg Oral Daily  ? ?Continuous Infusions: ? cefTRIAXone (ROCEPHIN)  IV 2 g (05/21/21 1152)  ? metronidazole 500 mg (05/21/21 0850)  ? ?PRN Meds:.acetaminophen **OR** acetaminophen, HYDROcodone-acetaminophen ?Medications Prior to Admission:  ?Prior to Admission medications   ?Medication Sig Start Date End Date Taking? Authorizing Provider  ?acetaminophen (TYLENOL) 500 MG tablet Take 1,000 mg by mouth 2 (two) times daily as needed for moderate pain or headache.   Yes [provider]  ?aspirin EC 81 MG tablet Take 81 mg by mouth daily. Swallow whole.   Yes [provider]  ?Calcium Carb-Cholecalciferol (CALCIUM 600 + D PO) Take 1 capsule by mouth in the morning and at bedtime.   Yes [provider]   ?lisinopril (ZESTRIL) 10 MG tablet Take 10 mg by mouth daily.   Yes [provider]  ?polyethylene glycol powder (GLYCOLAX/MIRALAX) 17 GM/SCOOP powder Take 17 g by mouth daily as needed for mild const

## 2021-05-22 DIAGNOSIS — D649 Anemia, unspecified: Secondary | ICD-10-CM | POA: Diagnosis not present

## 2021-05-22 DIAGNOSIS — E538 Deficiency of other specified B group vitamins: Secondary | ICD-10-CM | POA: Diagnosis not present

## 2021-05-22 NOTE — Progress Notes (Signed)
? ? ? [facility] ?                                                                            ? ? ?Caitlyn Braun, is a 88 y.o. female, DOB - 1922-11-05, [id number] ?Admit date - 05/18/2021    ?Outpatient Primary MD for the patient is Patient, No Pcp Per (Inactive) ? ?LOS - 3  days ? ? ? ?Brief summary  ? ? ?88 year old with history of hypertension on lisinopril, constipation on MiraLAX from assisted living facility brought to the emergency room with an episode of unresponsiveness.  She uses wheelchair at baseline.  She was found in the wheelchair very slow to respond and having abdominal cramps.  EMS found her hypotensive with GI bleeding.  ? ?Assessment & Plan  ? ? ?Assessment and Plan: ? ? ?Postural dizziness and presyncopal episode likely secondary to hypotension with lower GI bleed ?S/p 1 unit of PRBC transfusion.  Infectious work-up is negative. ?Anemia of blood loss ?CT of the abdomen shows diffuse colitis possibly ischemic colitis. ?Patient does not want any further work-up done at this time. ?GI consulted not a candidate for any kind of procedure at this time. ?Patient along with palliative care wanted to transition to comfort measures.  ? ? ? ?Severe iron deficiency ?S/p IV iron infusion. ? ? ? ?Bilateral lower extremity edema probably secondary to dependent edema. ? ? ?Vitamin B12 deficiency ?S/p IM injections. ? ? ? ?Patient deconditioned and debilitated at this time with with decline progressively in the last few weeks to months.  After palliative care discussions patient requested we transition to comfort measures at this time without any procedures, she would not want to come back to the hospital again.  Further discussions about disposition scheduled for today.  ?  ? ? ?RN Pressure Injury Documentation: ?Pressure Injury 05/19/21 Buttocks Left Stage 2 -  Partial thickness loss of dermis presenting as a shallow open injury with a red, pink wound bed without slough. Open wound on Left  buttocks (Active)  ?05/19/21 1600  ?Location: Buttocks  ?Location Orientation: Left  ?Staging: Stage 2 -  Partial thickness loss of dermis presenting as a shallow open injury with a red, pink wound bed without slough.  ?Wound Description (Comments): Open wound on Left buttocks  ?Present on Admission: Yes  ?Dressing Type Foam - Lift dressing to assess site every shift 05/22/21 0800  ?   ?Pressure Injury 05/19/21 Buttocks Right Stage 1 -  Intact skin with non-blanchable redness of a localized area usually over a bony prominence. Red area on buttocks (Active)  ?05/19/21 1600  ?Location: Buttocks  ?Location Orientation: Right  ?Staging: Stage 1 -  Intact skin with non-blanchable redness of a localized area usually over a bony prominence.  ?Wound Description (Comments): Red area on buttocks  ?Present on Admission: Yes  ?Dressing Type Foam - Lift dressing to assess site every shift 05/22/21 0800  ? ? ?Malnutrition Type: ? ?Nutrition Problem: Inadequate oral intake ?Etiology: poor appetite ? ? ?Malnutrition Characteristics: ? ?Signs/Symptoms: meal completion < 50% ? ? ?Nutrition Interventions: ? ?Interventions: Refer to RD note for recommendations ? ?Estimated body mass index is 22.14 kg/m? as calculated from the following: ?  Height as of this encounter: 5\' 3"  (1.6 m). ?  Weight as of this encounter: 56.7 kg. ? ?Code Status: DNR ?DVT Prophylaxis:   ? ? ?Level of Care: Level of care: Med-Surg ?Family Communication: none at bedside ? ?Disposition Plan:     Remains inpatient appropriate: Plan for palliative care meeting tomorrow regarding disposition. ? ?Procedures:  ?None  ? ?Consultants:   ?Palliative care ? ?Antimicrobials:  ? ?Anti-infectives (From admission, onward)  ? ? Start     Dose/Rate Route Frequency Ordered Stop  ? 05/20/21 0000  ciprofloxacin (CIPRO) IVPB 400 mg  Status:  Discontinued       ? 400 mg ?200 mL/hr over 60 Minutes Intravenous Every 24 hours 05/19/21 0009 05/19/21 1129  ? 05/19/21 1130  cefTRIAXone  (ROCEPHIN) 2 g in sodium chloride 0.9 % 100 mL IVPB  Status:  Discontinued       ? 2 g ?200 mL/hr over 30 Minutes Intravenous Every 24 hours 05/19/21 1129 05/21/21 1237  ? 05/19/21 0015  metroNIDAZOLE (FLAGYL) IVPB 500 mg  Status:  Discontinued       ? 500 mg ?100 mL/hr over 60 Minutes Intravenous Every 8 hours 05/19/21 0001 05/21/21 1237  ? 05/19/21 0015  ciprofloxacin (CIPRO) IVPB 400 mg       ? 400 mg ?200 mL/hr over 60 Minutes Intravenous  Once 05/19/21 0009 05/19/21 0211  ? ?  ? ? ? ?Medications ? ?Scheduled Meds: ? pantoprazole  40 mg Oral Daily  ? ?Continuous Infusions: ?PRN Meds:.acetaminophen **OR** acetaminophen, antiseptic oral rinse, HYDROcodone-acetaminophen, ondansetron **OR** ondansetron (ZOFRAN) IV, polyvinyl alcohol ? ? ? ?Subjective:  ? ?Nayleah Bartkiewicz was seen and examined today. Sleeping comfortably.  ? ?Objective:  ? ?Vitals:  ? 05/21/21 0640 05/21/21 1608 05/22/21 [id number] 05/22/21 0825  ?BP: (!) 160/64 (!) 158/66 (!) 182/73 (!) 182/80  ?Pulse: 71 74 64 64  ?Resp: 17 16  17   ?Temp: 98 ?F (36.7 ?C) 98.2 ?F (36.8 ?C) 98.4 ?F (36.9 ?C) 97.7 ?F (36.5 ?C)  ?TempSrc: Oral Oral Oral Oral  ?SpO2: 95% 99% 95% 97%  ?Weight:      ?Height:      ? ? ?Intake/Output Summary (Last 24 hours) at 05/22/2021 1418 ?Last data filed at 05/22/2021 1026 ?Gross per 24 hour  ?Intake 220 ml  ?Output 400 ml  ?Net -180 ml  ? ? ?Filed Weights  ? 05/19/21 0841  ?Weight: 56.7 kg  ? ? ? ?Exam ?General exam: Appears calm and comfortable  ?Respiratory system: Clear to auscultation. Respiratory effort normal. ?Cardiovascular system: S1 & S2 heard, RRR. No JVD, No pedal edema. ?Gastrointestinal system: Abdomen is nondistended, soft and nontender.  ?Central nervous system: Alert and oriented. No focal neurological deficits. ?Extremities: no pedal edema  ?Skin: No rashes, lesions or ulcers ?Psychiatry:Mood & affect appropriate.  ? ? ?Data Reviewed:  I have personally reviewed following labs and imaging studies ? ? ?CBC ?Lab Results   ?Component Value Date  ? WBC 12.3 (H) 05/21/2021  ? RBC 3.23 (L) 05/21/2021  ? HGB 8.3 (L) 05/21/2021  ? HCT 26.3 (L) 05/21/2021  ? MCV 81.4 05/21/2021  ? MCH 25.7 (L) 05/21/2021  ? PLT 374 05/21/2021  ? MCHC 31.6 05/21/2021  ? RDW 15.7 (H) 05/21/2021  ? LYMPHSABS 5.3 (H) 05/18/2021  ? MONOABS 0.6 05/18/2021  ? EOSABS 0.4 05/18/2021  ? BASOSABS 0.0 05/18/2021  ? ? ? ?Last metabolic panel ?Lab Results  ?Component Value Date  ? NA 138 05/21/2021  ? K 4.1  05/21/2021  ? CL 109 05/21/2021  ? CO2 23 05/21/2021  ? BUN 33 (H) 05/21/2021  ? CREATININE 1.63 (H) 05/21/2021  ? GLUCOSE 95 05/21/2021  ? GFRNONAA 29 (L) 05/21/2021  ? GFRAA 59 (L) 09/04/2019  ? CALCIUM 8.6 (L) 05/21/2021  ? PHOS 4.0 05/20/2021  ? PROT 5.2 (L) 05/19/2021  ? ALBUMIN 2.8 (L) 05/19/2021  ? BILITOT 0.5 05/19/2021  ? ALKPHOS 59 05/19/2021  ? AST 26 05/19/2021  ? ALT 13 05/19/2021  ? ANIONGAP 6 05/21/2021  ? ? ?CBG (last 3)  ?No results for input(s): GLUCAP in the last 72 hours.  ? ? ?Coagulation Profile: ?Recent Labs  ?Lab 05/18/21 ?1945  ?INR 1.0  ? ? ? ? ?Radiology Studies: ?No results found. ? ? ? ? ?Hosie Poisson M.D. ?[facility] ?05/22/2021, 2:18 PM ? ?Available via Epic secure chat 7am-7pm ?After 7 pm, please refer to night coverage provider listed on amion. ? ? ? ?

## 2021-05-22 NOTE — Progress Notes (Signed)
PT Cancellation Note and Discharge ? ?Patient Details ?Name: Caitlyn Braun ?MRN: [id number] ?DOB: Oct 20, 1922 ? ? ?Cancelled Treatment:    Reason Eval/Treat Not Completed: Chart reviewed prior to PT treatment, and noted per Palliative pt transitioned to full comfort care. PT will sign off at this time. If needs change, please reconsult.  ? ? ?[name] ?05/22/2021, 1:53 PM ? ?Rolinda Roan, PT, DPT ?Acute Rehabilitation Services ?Secure Chat Preferred ?Office: [phone] ? ?

## 2021-05-22 NOTE — Progress Notes (Signed)
? ?                                                                                                                                                     ?                                                   ?Daily Progress Note  ? ?Patient Name: Caitlyn Braun       Date: 05/22/2021 ?DOB: September 29, 1922  Age: 88 y.o. MRN#: [id number] ?Attending Physician: Hosie Poisson, MD ?Primary Care Physician: Patient, No Pcp Per (Inactive) ?Admit Date: 05/18/2021 ? ?Reason for Consultation/Follow-up: Establishing goals of care ? ?Subjective: ?Medical records reviewed. Patient assessed at the bedside. She reports feeling fine, no acute concerns. Her son Simona Huh and daughter-in-law Rollene Fare are present at the bedside for scheduled family meeting. ? ?We discussed patient's concerns for level of support at [facility] and for referral to hospice. Reviewed hospice philosophy in detail and provided reassurance on patient's concern that she would not be accepted at the hospital should she need help for any symptoms. Counseled on the multidisciplinary approach and expert care available through hospice for symptom management at end of life. Family shares their prior experience with hospice agreeing that this would be very appropriate and helpful given patient's goals of care. Patient ultimately feels better about accepting this support and looks forward to getting back to her familiar home environment as soon as possible. ? ?An extensive conversation was had, covering concepts specific to code status, artifical feeding and hydration, continued IV antibiotics and rehospitalization.   ? ?Cardiopulmonary Resuscitation: Do Not Attempt Resuscitation (DNR/No CPR)  ?Medical Interventions: Comfort Measures: Keep clean, warm, and dry. Use medication by any route, positioning, wound care, and other measures to relieve pain and suffering. Use oxygen, suction and manual treatment of airway obstruction as needed for comfort. Do not transfer to the hospital unless  comfort needs cannot be met in current location.  ?Antibiotics: No antibiotics (provide other measures to ensure comfort)  ?IV Fluids: No IV fluids (provide other measures to ensure comfort)  ?Feeding Tube: No feeding tube  ?   ? ?Questions and concerns addressed. PMT will continue to support holistically.  ? ?Length of Stay: 3 ? ?Current Medications: ?Scheduled Meds:  ? pantoprazole  40 mg Oral Daily  ? ?PRN Meds: ?acetaminophen **OR** acetaminophen, antiseptic oral rinse, HYDROcodone-acetaminophen, ondansetron **OR** ondansetron (ZOFRAN) IV, polyvinyl alcohol ? ?Physical Exam ?Vitals and nursing note reviewed.  ?Constitutional:   ?   General: She is not in acute distress. ?   Appearance: She is ill-appearing.  ?Cardiovascular:  ?   Rate and Rhythm: Normal rate.  ?Pulmonary:  ?  Effort: Pulmonary effort is normal.  ?Skin: ?   General: Skin is warm and dry.  ?Neurological:  ?   Mental Status: She is alert. Mental status is at baseline.  ?Psychiatric:     ?   Mood and Affect: Mood normal.  ?         ? ?Vital Signs: BP (!) 182/80 (BP Location: Left Arm)   Pulse 64   Temp 97.7 ?F (36.5 ?C) (Oral)   Resp 17   Ht '5\' 3"'  (1.6 m)   Wt 56.7 kg   SpO2 97%   BMI 22.14 kg/m?  ?SpO2: SpO2: 97 % ?O2 Device: O2 Device: Room Air ?O2 Flow Rate:   ? ?Intake/output summary:  ?Intake/Output Summary (Last 24 hours) at 05/22/2021 1209 ?Last data filed at 05/22/2021 1026 ?Gross per 24 hour  ?Intake 220 ml  ?Output 400 ml  ?Net -180 ml  ? ?LBM: Last BM Date : 05/21/21 ?Baseline Weight: Weight: 56.7 kg ?Most recent weight: Weight: 56.7 kg ? ?     ?Palliative Assessment/Data: 40% ? ? ? ? ? ?Patient Active Problem List  ? Diagnosis Date Noted  ? Goals of care, counseling/discussion   ? Pressure injury of skin 05/20/2021  ? Syncope and collapse 05/19/2021  ? Iron deficiency anemia   ? Heme positive stool   ? B12 deficiency   ? Symptomatic anemia 05/18/2021  ? Constipation 05/18/2021  ? Essential hypertension 05/18/2021  ? AKI (acute kidney  injury) ([place]) 05/18/2021  ? Postural dizziness with presyncope 05/18/2021  ? Breast mass, right 05/18/2021  ? Bilateral leg edema 05/18/2021  ? Abdominal pain 05/18/2021  ? Colitis 05/18/2021  ? Weakness 09/02/2019  ? URI (upper respiratory infection) 09/02/2019  ? ? ?Palliative Care Assessment & Plan  ? ?Patient Profile: ?88 y.o. female with past medical history of HTN, HOH admitted on 05/18/2021 with presyncope, symptomatic anemia, AKI, ischemic colitis. ?  ?Patient presents from ALF and is not a candidate for endoscopy, EGD. PMT has been consulted to assist with goals of care conversation. ?  ? ?Assessment: ?Goals of care conversation ?Presyncope ?Anemia of blood loss ?Possible ischemic colitis ? ?Recommendations/Plan: ?DNR ?Continue comfort care measures per MAR, no PRNs required thus far ?Patient and family are agreeable to referral to hospice at [facility] ?MOST form completed and family was provided with a copy, will scan a copy into EMR and place original on hard chart for discharge ?Pscyhosocial and emotional support provided ?PMT will continue to follow ? ?Prognosis: ? < 6 months ? ?Discharge Planning: ?ALF with hospice ? ?Care plan was discussed with patient, patient's son/DIL, Dr. Karleen Hampshire ? ? ?MDM: [place] ? ? ? ?Dorthy Cooler, PA-C ?Palliative Medicine Team ?Team phone # [phone] ? ?Thank you for allowing the Palliative Medicine Team to assist in the care of this patient. Please utilize secure chat with additional questions, if there is no response within 30 minutes please call the above phone number. ? ?Palliative Medicine Team providers are available by phone from 7am to 7pm daily and can be reached through the team cell phone.  ?Should this patient require assistance outside of these hours, please call the patient's attending physician.  ? ? ? ?

## 2021-05-22 NOTE — Care Management Important Message (Signed)
Important Message ? ?Patient Details  ?Name: Caitlyn Braun ?MRN: [id number] ?Date of Birth: [birth year] ? ? ?Medicare Important Message Given:  Yes ? ?Patient is at End of Life out of respect IM will be mailed to the patient home address. ? ? ?Jennye Runquist ?05/22/2021, 2:34 PM ?

## 2021-05-23 DIAGNOSIS — E538 Deficiency of other specified B group vitamins: Secondary | ICD-10-CM | POA: Diagnosis not present

## 2021-05-23 DIAGNOSIS — D649 Anemia, unspecified: Secondary | ICD-10-CM | POA: Diagnosis not present

## 2021-05-23 DIAGNOSIS — I10 Essential (primary) hypertension: Secondary | ICD-10-CM | POA: Diagnosis not present

## 2021-05-23 MED ORDER — LOPERAMIDE HCL 2 MG PO CAPS: 2.0000 mg | ORAL_CAPSULE | ORAL | 0 refills | Status: DC | PRN

## 2021-05-23 MED ORDER — LOPERAMIDE HCL 2 MG PO CAPS
2.0000 mg | ORAL_CAPSULE | ORAL | Status: DC | PRN
  Filled 2021-05-23: qty 1

## 2021-05-23 NOTE — Progress Notes (Signed)
Pt`s son called and stated she does not want her mom discharge late this night,(Secretary called [facility] pt will not be discharged until about midnight) he wants her to go tomorrow morning instead. On call Dr Toniann Fail  made aware via phone call. ?

## 2021-05-23 NOTE — [place] FL2 (Signed)
? MEDICAID FL2 LEVEL OF CARE SCREENING TOOL  ?  ? ?IDENTIFICATION  ?Patient Name: ?Caitlyn Braun Birthdate: 1922/02/07 Sex: female Admission Date (Current Location): ?05/18/2021  ?[place] and [place] Number: ? [place] ?  Facility and Address:  ?[facility], [place] [address] [place], [place] [address] ?     Provider Number: ?[id number]  ?Attending Physician Name and Address:  ?Hosie Poisson, MD ? Relative Name and Phone Number:  ?Shakiah Brinlee, [phone] ?   ?Current Level of Care: ?Hospital Recommended Level of Care: ?Assisted Living Facility Prior Approval Number: ?  ? ?Date Approved/Denied: ?  PASRR Number: ?  ? ?Discharge Plan: ?Other (Comment) (ALF) ?  ? ?Current Diagnoses: ?Patient Active Problem List  ? Diagnosis Date Noted  ? Goals of care, counseling/discussion   ? Pressure injury of skin 05/20/2021  ? Syncope and collapse 05/19/2021  ? Iron deficiency anemia   ? Heme positive stool   ? B12 deficiency   ? Symptomatic anemia 05/18/2021  ? Constipation 05/18/2021  ? Essential hypertension 05/18/2021  ? AKI (acute kidney injury) ([place]) 05/18/2021  ? Postural dizziness with presyncope 05/18/2021  ? Breast mass, right 05/18/2021  ? Bilateral leg edema 05/18/2021  ? Abdominal pain 05/18/2021  ? Colitis 05/18/2021  ? Weakness 09/02/2019  ? URI (upper respiratory infection) 09/02/2019  ? ? ?Orientation RESPIRATION BLADDER Height & Weight   ?  ?Self ? Normal Incontinent, External catheter Weight: 125 lb (56.7 kg) ?Height:  5\' 3"  (160 cm)  ?BEHAVIORAL SYMPTOMS/MOOD NEUROLOGICAL BOWEL NUTRITION STATUS  ?    Incontinent Diet (See DC summary)  ?AMBULATORY STATUS COMMUNICATION OF NEEDS Skin   ?Total Care Verbally PU Stage and Appropriate Care (PU L Buttock St 2, R Buttock St 1) ?  ?  ?  ?    ?     ?     ? ? ?Personal Care Assistance Level of Assistance  ?Bathing, Feeding, Dressing Bathing Assistance: Maximum assistance ?Feeding assistance: Maximum assistance ?Dressing Assistance:  Maximum assistance ?   ? ?Functional Limitations Info  ?Sight, Hearing, Speech Sight Info: Adequate ?Hearing Info: Impaired ?Speech Info: Adequate  ? ? ?SPECIAL CARE FACTORS FREQUENCY  ?    ?  ?  ?  ?  ?  ?  ?   ? ? ?Contractures Contractures Info: Not present  ? ? ?Additional Factors Info  ?Code Status, Allergies Code Status Info: DNR ?Allergies Info: NKA ?  ?  ?  ?   ? ?Current Medications (05/23/2021):  This is the current hospital active medication list ?Current Facility-Administered Medications  ?Medication Dose Route Frequency Provider Last Rate Last Admin  ? acetaminophen (TYLENOL) tablet 650 mg  650 mg Oral Q6H PRN Toy Baker, MD   650 mg at 05/19/21 0333  ? Or  ? acetaminophen (TYLENOL) suppository 650 mg  650 mg Rectal Q6H PRN Toy Baker, MD      ? antiseptic oral rinse (BIOTENE) solution 15 mL  15 mL Topical PRN Cooper, Josseline P, PA-C      ? HYDROcodone-acetaminophen (NORCO/VICODIN) 5-325 MG per tablet 1-2 tablet  1-2 tablet Oral Q4H PRN Toy Baker, MD   1 tablet at 05/20/21 1204  ? loperamide (IMODIUM) capsule 2 mg  2 mg Oral PRN Hosie Poisson, MD      ? ondansetron (ZOFRAN-ODT) disintegrating tablet 4 mg  4 mg Oral Q6H PRN Cooper, Josseline P, PA-C      ? Or  ? ondansetron (ZOFRAN) injection 4 mg  4  mg Intravenous Q6H PRN Cooper, Josseline P, PA-C      ? pantoprazole (PROTONIX) EC tablet 40 mg  40 mg Oral Daily Armbruster, Carlota Raspberry, MD   40 mg at 05/23/21 1016  ? polyvinyl alcohol (LIQUIFILM TEARS) 1.4 % ophthalmic solution 1 drop  1 drop Both Eyes QID PRN Cooper, Josseline P, PA-C      ? ? ? ?Discharge Medications: ? ?STOP taking these medications   ?  ?aspirin EC 81 MG tablet ?   ?azithromycin 250 MG tablet ?Commonly known as: ZITHROMAX ?   ?CALCIUM 600 + D PO ?   ?lisinopril 10 MG tablet ?Commonly known as: ZESTRIL ?   ?lisinopril 40 MG tablet ?Commonly known as: ZESTRIL ?   ?polyethylene glycol powder 17 GM/SCOOP powder ?Commonly known as: GLYCOLAX/MIRALAX ?   ?  ?   ?   ?TAKE these medications   ?  ?acetaminophen 500 MG tablet ?Commonly known as: TYLENOL ?Take 1,000 mg by mouth 2 (two) times daily as needed for moderate pain or headache. ?   ?loperamide 2 MG capsule ?Commonly known as: IMODIUM ?Take 1 capsule (2 mg total) by mouth as needed for diarrhea or loose stools. ?   ? ?Relevant Imaging Results: ? ?Relevant Lab Results: ? ? ?Additional Information ?ssn#[id number] ? ?Emeterio Reeve, LCSW ? ? ? ? ?

## 2021-05-23 NOTE — TOC Transition Note (Signed)
Transition of Care (TOC) - CM/SW Discharge Note ? ? ?Patient Details  ?Name: Caitlyn Braun ?MRN: [id number] ?Date of Birth: 04-04-1922 ? ?Transition of Care (TOC) CM/SW Contact:  ?Jimmy Picket, LCSW ?Phone Number: ?05/23/2021, 12:49 PM ? ? ?Clinical Narrative:    ? ?Per MD patient ready for DC to .[facility]. RN, patient, patient's family, and facility notified of DC. Discharge Summary, hospice referral, and FL2 sent to facility at [phone]. DC packet on chart. Ambulance transport requested for patient.  ?  ?RN to call report to [phone]. ? ?CSW will sign off for now as social work intervention is no longer needed. Please consult [place] again if new needs arise. ? ? ?Final next level of care: Assisted Living ?Barriers to Discharge: No Barriers Identified ? ? ?Patient Goals and CMS Choice ?  ?CMS Medicare.gov Compare Post Acute Care list provided to:: Patient ?Choice offered to / list presented to : Patient, Adult Children ? ?Discharge Placement ?  ?           ?Patient chooses bed at: Other - please specify in the comment section below: [facility]) ?Patient to be transferred to facility by: ptar ?Name of family member notified: son ?Patient and family notified of of transfer: 05/23/21 ? ?Discharge Plan and Services ?  ?  ?           ?  ?  ?  ?  ?  ?  ?  ?  ?  ?  ? ?Social Determinants of Health (SDOH) Interventions ?  ? ? ?Readmission Risk Interventions ?   ? View : No data to display.  ?  ?  ?  ? ?Jimmy Picket, LCSW ?Clinical Social Worker ? ? ? ? ?

## 2021-05-23 NOTE — Progress Notes (Signed)
OT Cancellation Note ? ?Patient Details ?Name: Caitlyn Braun ?MRN: [id number] ?DOB: 1922-09-03 ? ? ?Cancelled Treatment:     Discussed with COTA. Pt full comfort. Will sign off.  ? ?Klea Nall,HILLARY ?05/23/2021, 6:32 AM ?Luisa Dago, OT/L  ? ?Acute OT Clinical Specialist ?Acute Rehabilitation Services ?Pager [phone] ?Office [phone]  ?

## 2021-05-23 NOTE — Progress Notes (Signed)
Patient Caitlyn Braun      DOB: 1922/04/05      ACZ:[id number] ? ? ? ?  ?Palliative Medicine Team ? ? ? ?Subjective: Bedside symptom check completed. No family or visitors bedside during visit.  ? ? ?Physical exam: Patient resting in bed at time of visit. Patient pleasantly conversational and very alert this morning. Breathing even and non-labored, no excessive secretions noted. Patient without physical or non-verbal signs of pain or discomfort at this time. Distal pulses strong, all extremities warm and pink.  ? ? ?Assessment and plan: Patient shared stories with this RN about [place] and her experience in the hospital during this admission. This RN available for listening and emotional support. Patient denies pain or discomfort at this time, just some frustration with frequent loose stools. All questions answered. Ongoing conversations regarding patient's discharge plan with hospice. This RN touched base with PMT PA regarding next steps for patient. Bedside RN Bev without any needs or concerns at this time. Will continue to follow for any changes or advances.  ? ? ?Thank you for allowing the Palliative Medicine Team to assist in the care of this patient. ?  ?  ?Shelda Jakes, MSN, RN ?Palliative Medicine Team ?Team Phone: [phone]  ?This phone is monitored 7a-7p, please reach out to attending physician outside of these hours for urgent needs.   ?

## 2021-05-23 NOTE — Discharge Summary (Signed)
?Physician Discharge Summary ?  ?Patient: Caitlyn Braun MRN: [id number] DOB: [birth year]  ?Admit date:     05/18/2021  ?Discharge date: 05/23/21  ?Discharge Physician: Kathlen Mody  ? ?PCP: Patient, No Pcp Per (Inactive)  ? ?Recommendations at discharge:  ?Please follow up with Hospice at ALF.  ?Patient is on comfort care measures at this time.  ? ?Discharge Diagnoses: ?Principal Problem: ?  Symptomatic anemia ?Active Problems: ?  Colitis ?  AKI (acute kidney injury) (H[facility]) ?  Postural dizziness with presyncope ?  Essential hypertension ?  Bilateral leg edema ?  Breast mass, right ?  Abdominal pain ?  Weakness ?  Constipation ?  Syncope and collapse ?  Iron deficiency anemia ?  Heme positive stool ?  B12 deficiency ?  Pressure injury of skin ?  Goals of care, counseling/discussion ? ? ? ?Hospital Course: ? ?88 year old with history of hypertension on lisinopril, constipation on MiraLAX from assisted living facility brought to the emergency room with an episode of unresponsiveness.  She uses wheelchair at baseline.  She was found in the wheelchair very slow to respond and having abdominal cramps.  EMS found her hypotensive with GI bleeding. GI consulted, suggested that she is not a candidate for invasive work up . Palliative care consulted and she requested comfort measures at this time and discharged to ALF with hospice and focus on comfort.  ? ?Assessment and Plan: ? ? ?Postural dizziness and presyncopal episode likely secondary to hypotension with lower GI bleed ?S/p 1 unit of PRBC transfusion.  Infectious work-up is negative. ?Acute Anemia of blood loss ?CT of the abdomen shows diffuse colitis possibly ischemic colitis. ?Patient does not want any further work-up done at this time. ?GI consulted not a candidate for any kind of procedure at this time. ?Patient along with palliative care wanted to transition to comfort measures.  ?  ?  ?  ?Severe iron deficiency ?S/p IV iron infusion. ?  ?  ?  ?Bilateral lower  extremity edema probably secondary to dependent edema. ?  ?  ?Vitamin B12 deficiency ?S/p IM injections. ?  ?  ?  ?Patient deconditioned and debilitated at this time with with decline progressively in the last few weeks to months.  After palliative care discussions patient requested we transition to comfort measures at this time without any procedures, she would not want to come back to the hospital again.   ?  ?  ?  ?RN Pressure Injury Documentation: ?    ?Pressure Injury 05/19/21 Buttocks Left Stage 2 -  Partial thickness loss of dermis presenting as a shallow open injury with a red, pink wound bed without slough. Open wound on Left buttocks (Active)  ?05/19/21 1600  ?Location: Buttocks  ?Location Orientation: Left  ?Staging: Stage 2 -  Partial thickness loss of dermis presenting as a shallow open injury with a red, pink wound bed without slough.  ?Wound Description (Comments): Open wound on Left buttocks  ?Present on Admission: Yes  ?Dressing Type Foam - Lift dressing to assess site every shift 05/22/21 0800  ?   ?Pressure Injury 05/19/21 Buttocks Right Stage 1 -  Intact skin with non-blanchable redness of a localized area usually over a bony prominence. Red area on buttocks (Active)  ?05/19/21 1600  ?Location: Buttocks  ?Location Orientation: Right  ?Staging: Stage 1 -  Intact skin with non-blanchable redness of a localized area usually over a bony prominence.  ?Wound Description (Comments): Red area on buttocks  ?Present on Admission: Yes  ?  Dressing Type Foam - Lift dressing to assess site every shift 05/22/21 0800  ?  ?  ? ?Consultants: palliative care ?GI  ?Procedures performed: none.   ?Disposition: Assisted living ?Diet recommendation:  ?Discharge Diet Orders (From admission, onward)  ? ?  Start     Ordered  ? 05/23/21 0000  Diet - low sodium heart healthy       ? 05/23/21 1208  ? ?  ?  ? ?  ? ?Regular diet ?DISCHARGE MEDICATION: ?Allergies as of 05/23/2021   ?No Known Allergies ?  ? ?  ?Medication List  ?   ? ?STOP taking these medications   ? ?aspirin EC 81 MG tablet ?  ?azithromycin 250 MG tablet ?Commonly known as: ZITHROMAX ?  ?CALCIUM 600 + D PO ?  ?lisinopril 10 MG tablet ?Commonly known as: ZESTRIL ?  ?lisinopril 40 MG tablet ?Commonly known as: ZESTRIL ?  ?polyethylene glycol powder 17 GM/SCOOP powder ?Commonly known as: GLYCOLAX/MIRALAX ?  ? ?  ? ?TAKE these medications   ? ?acetaminophen 500 MG tablet ?Commonly known as: TYLENOL ?Take 1,000 mg by mouth 2 (two) times daily as needed for moderate pain or headache. ?  ?loperamide 2 MG capsule ?Commonly known as: IMODIUM ?Take 1 capsule (2 mg total) by mouth as needed for diarrhea or loose stools. ?  ? ?  ? ?  ?  ? ? ?  ?Discharge Care Instructions  ?(From admission, onward)  ?  ? ? ?  ? ?  Start     Ordered  ? 05/23/21 0000  Discharge wound care:       ?Comments: LOCAL WOUND CARE  ? 05/23/21 1208  ? ?  ?  ? ?  ? ? ?Discharge Exam: ?Filed Weights  ? 05/19/21 0841  ?Weight: 56.7 kg  ? ?General exam: deconditioned ill appearing lady .  ?Respiratory system: Clear to auscultation. Respiratory effort normal. ?Cardiovascular system: S1 & S2 heard, RRR.  ?Gastrointestinal system: Abdomen is nondistended, soft and nontender. ?Central nervous system: Alert and oriented ?Extremities: Symmetric 5 x 5 power. ?Skin: pressure injury in the back.  ?Psychiatry: mood is appropriate.  ? ? ? ? ?The results of significant diagnostics from this hospitalization (including imaging, microbiology, ancillary and laboratory) are listed below for reference.  ? ?Imaging Studies: ?CT Abdomen Pelvis W Contrast ? ?Result Date: 05/18/2021 ?CLINICAL DATA:  Acute abdominal pain EXAM: CT ABDOMEN AND PELVIS WITH CONTRAST TECHNIQUE: Multidetector CT imaging of the abdomen and pelvis was performed using the standard protocol following bolus administration of intravenous contrast. RADIATION DOSE REDUCTION: This exam was performed according to the departmental dose-optimization program which includes  automated exposure control, adjustment of the mA and/or kV according to patient size and/or use of iterative reconstruction technique. CONTRAST:  100mL OMNIPAQUE IOHEXOL 300 MG/ML  SOLN COMPARISON:  None. FINDINGS: Lower chest: No acute abnormality. Hepatobiliary: Fatty infiltration of the liver is noted. The gallbladder is within normal limits. Pancreas: Unremarkable. No pancreatic ductal dilatation or surrounding inflammatory changes. Spleen: Normal in size without focal abnormality. Adrenals/Urinary Tract: Adrenal glands are within normal limits. Kidneys demonstrate a normal enhancement pattern bilaterally. Small 1 cm cyst is noted in the upper pole of the right kidney. No follow-up is recommended. The bladder is partially distended. Stomach/Bowel: Diverticular change of the colon is noted with diffuse wall thickening throughout the rectosigmoid and descending colon consistent with colitis. No abscess or perforation is seen. No free air is noted. The more proximal colon shows fluid within although  no inflammatory changes are seen. The appendix is not well visualized. No inflammatory changes to suggest appendicitis are seen. The small bowel is unremarkable. Moderate to large hiatal hernia is noted. Vascular/Lymphatic: Aortic atherosclerosis. No enlarged abdominal or pelvic lymph nodes. Reproductive: Uterus demonstrates some generalized decreased attenuation centrally of uncertain significance. Given the patient's age no further follow-up is recommended. Other: No abdominal wall hernia or abnormality. No abdominopelvic ascites. Musculoskeletal: Prior left hip replacement is noted. Degenerative changes of lumbar spine are seen. No acute compression fracture is seen. IMPRESSION: Changes of diverticulosis within the colon as well as diffuse inflammatory change involving the descending, sigmoid and rectal walls consistent with colitis. No evidence of perforation or abscess formation is noted. Moderate to large hiatal  hernia. Electronically Signed   By: Alcide Clever M.D.   On: 05/18/2021 23:55  ? ?DG Chest Port 1 View ? ?Result Date: 05/18/2021 ?CLINICAL DATA:  Hypotension. EXAM: PORTABLE CHEST 1 VIEW COMPARISON:  August 16, 20

## 2021-05-24 NOTE — TOC Transition Note (Signed)
Transition of Care (TOC) - CM/SW Discharge Note ? ? ?Patient Details  ?Name: Caitlyn Braun ?MRN: [id number] ?Date of Birth: 09-25-1922 ? ?Transition of Care (TOC) CM/SW Contact:  ?Elma Shands, [place], [place] ?Phone Number: ?05/24/2021, 9:06 AM ? ? ?Clinical Narrative:    ?[facility] contacted for transport to [facility] on [address]. Patient will return to room 10. Medical necessity form updated. ? ?[employer], LCSW ?Transition of Care ?[phone] ? ? ? ?Final next level of care: Assisted Living ?Barriers to Discharge: No Barriers Identified ? ? ?Patient Goals and CMS Choice ?  ?CMS Medicare.gov Compare Post Acute Care list provided to:: Patient ?Choice offered to / list presented to : Patient, Adult Children ? ?Discharge Placement ?  ?           ?Patient chooses bed at: Other - please specify in the comment section below: [name] ?Patient to be transferred to facility by: ptar ?Name of family member notified: son ?Patient and family notified of of transfer: 05/23/21 ? ?Discharge Plan and Services ?  ?  ?           ?  ?  ?  ?  ?  ?  ?  ?  ?  ?  ? ?Social Determinants of Health (SDOH) Interventions ?  ? ? ?Readmission Risk Interventions ?   ? View : No data to display.  ?  ?  ?  ? ? ? ? ? ?

## 2021-08-18 IMAGING — CT CT CERVICAL SPINE W/O CM
2 series · 14 of 27 positions shown, 18 images · non-contrast
Comparison: None

CLINICAL DATA: Head trauma

EXAM:
CT CERVICAL SPINE WITHOUT CONTRAST
TECHNIQUE: Multidetector CT imaging of the cervical spine was performed without
intravenous contrast. Multiplanar CT image reconstructions were also
generated.

[Series 3: c_spine 2.0 i30s 3 · axial · 0.36mm/px · z∈[+639,+755]mm · 9 of 70 slices shown, 12 images]
[im 6/70  soft-tissue]
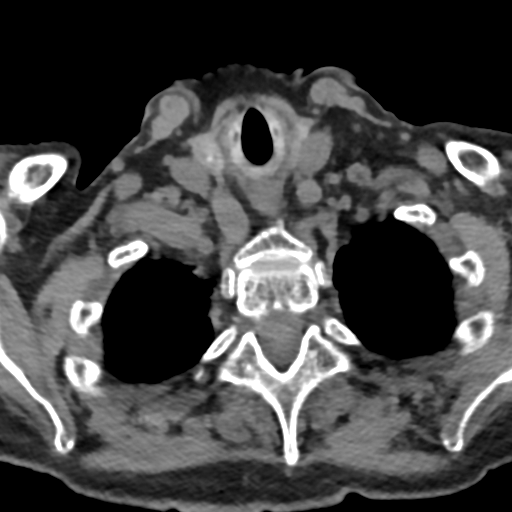
[im 6/70  bone]
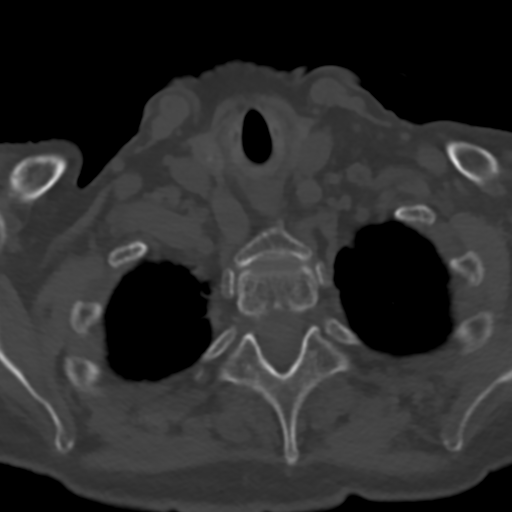
[im 16/70  bone]
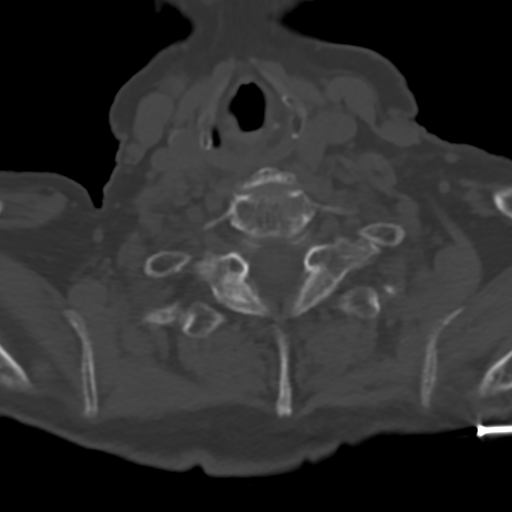
[im 22/70  bone]
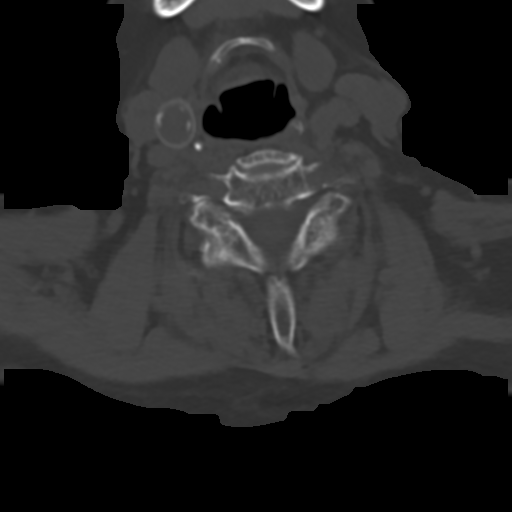
[im 27/70  bone]
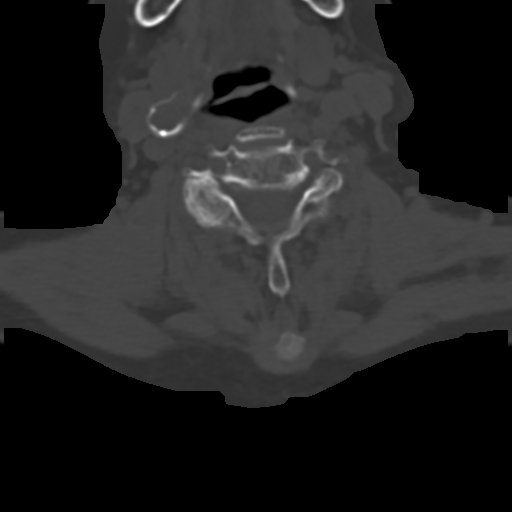
[im 38/70  soft-tissue]
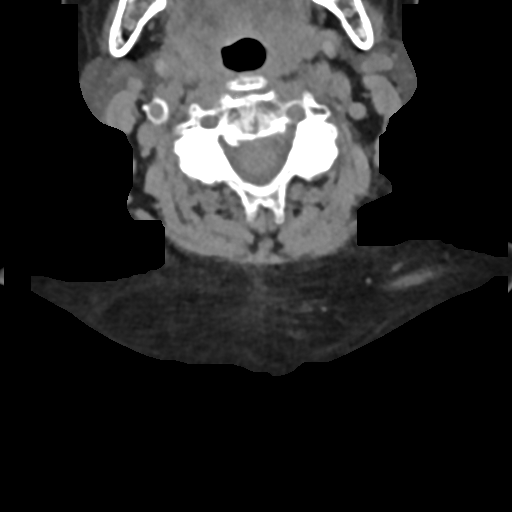
[im 38/70  bone]
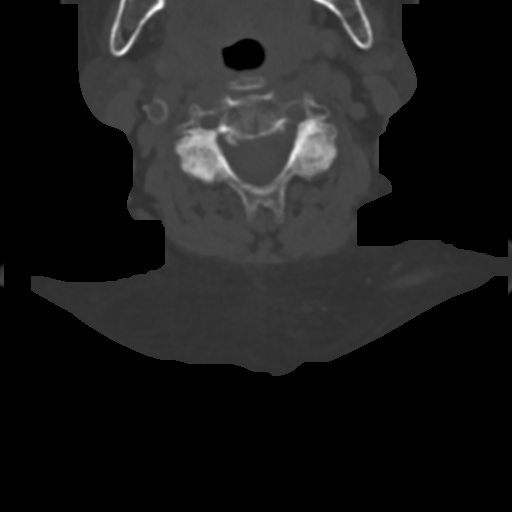
[im 43/70  bone]
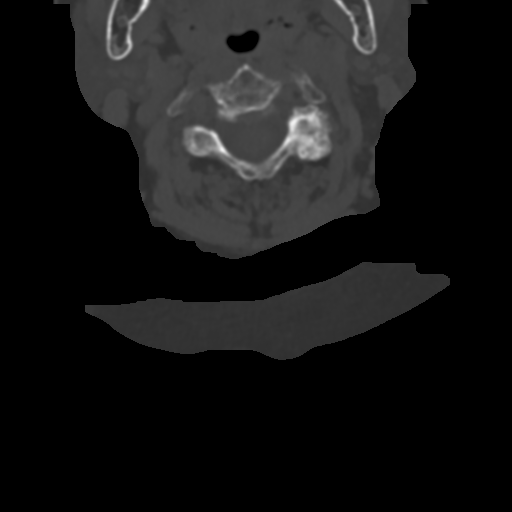
[im 48/70  bone]
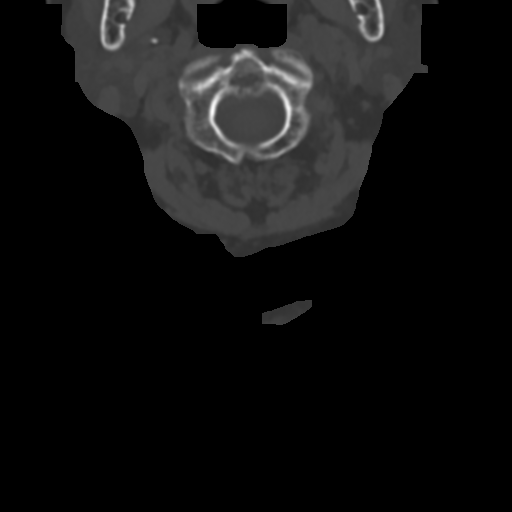
[im 59/70  bone]
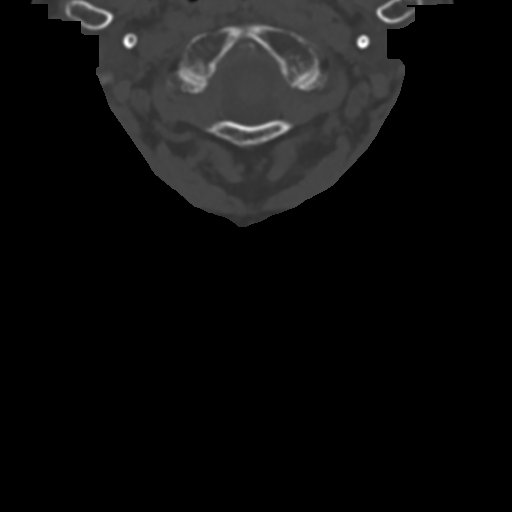
[im 64/70  soft-tissue]
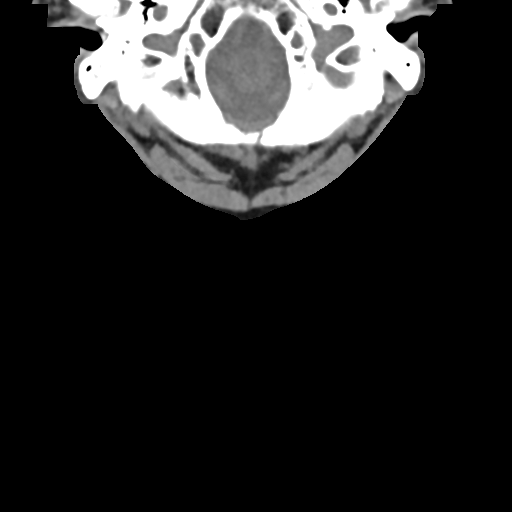
[im 64/70  bone]
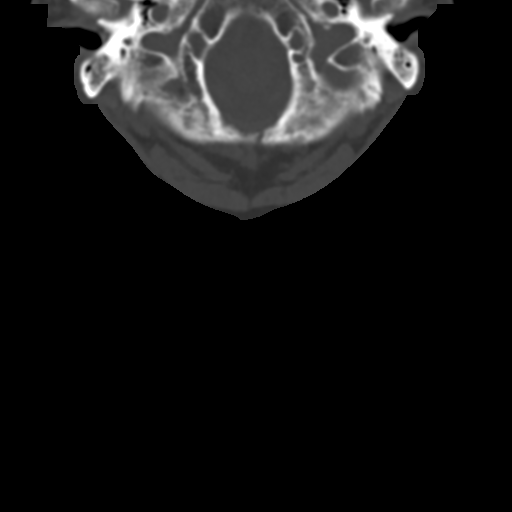

[Series 6: sagittals · sagittal · 0.28mm/px · 5 of 55 slices shown, 6 images]
[im 19/55  bone]
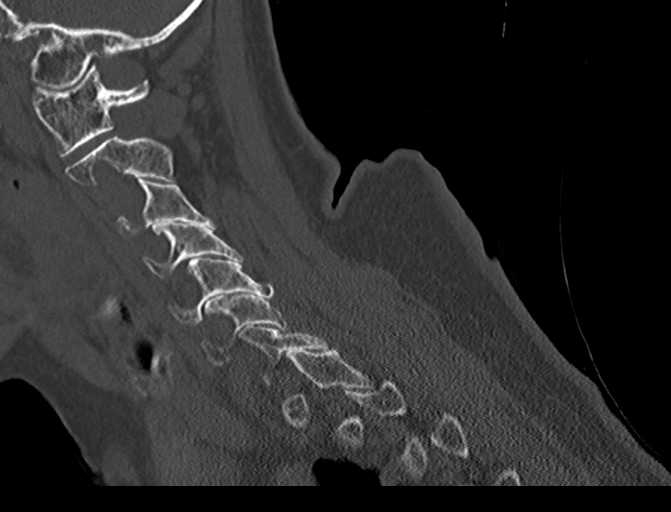
[im 23/55  bone]
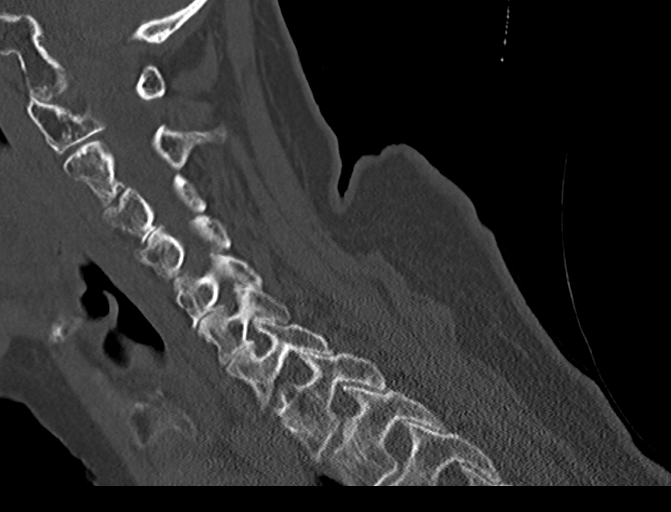
[im 28/55  soft-tissue]
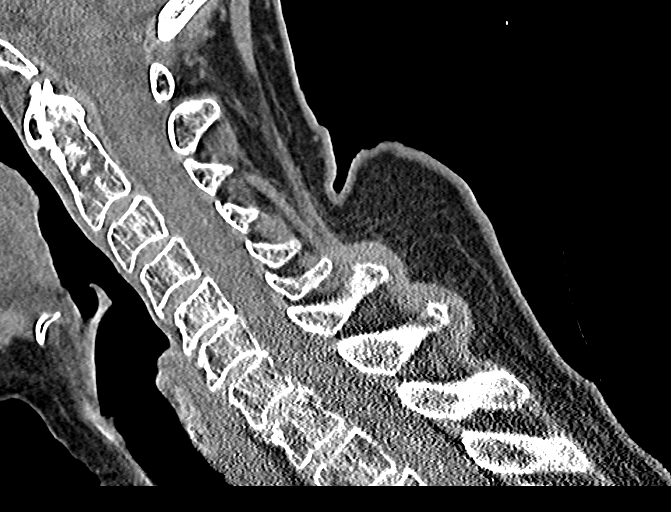
[im 28/55  bone]
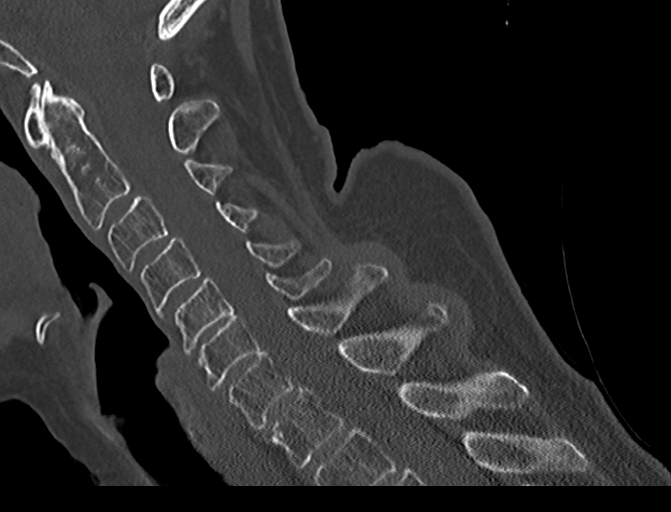
[im 32/55  bone]
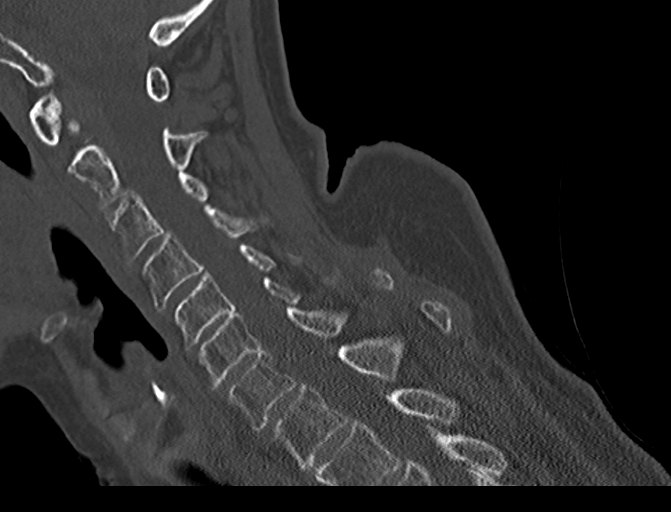
[im 37/55  bone]
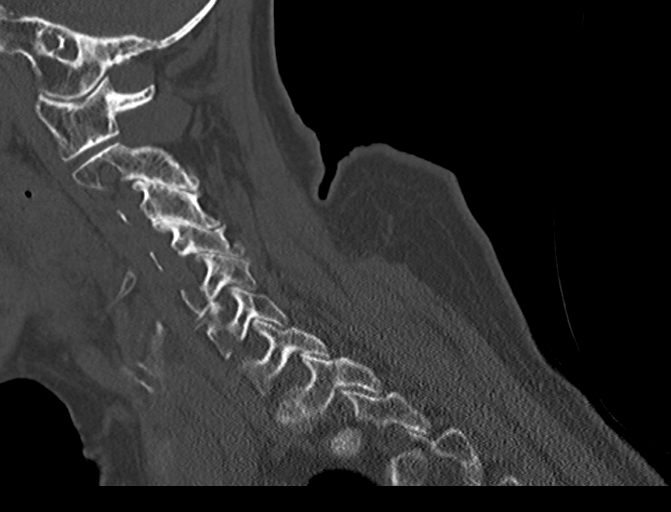

[14 of 27 positions shown; findings below may reference images not displayed]

FINDINGS: Alignment: Straightening of the cervical spine. Trace
anterolisthesis C3 on C4 and C4 on C5. Facet alignment is
maintained.

Skull base and vertebrae: No acute fracture. No primary bone lesion
or focal pathologic process.

Soft tissues and spinal canal: No prevertebral fluid or swelling. No
visible canal hematoma.

Disc levels: Multiple level degenerative change, most advanced and
moderate at C5-C6. Facet degenerative change at multiple levels.

Upper chest: Suspected prior right carotid intervention. Lung apices
are clear

Other: None
IMPRESSION: Straightening with trace anterior listhesis C3 on C4 and C4 on C5.
No fracture is seen

## 2021-11-17 ENCOUNTER — Emergency Department (HOSPITAL_COMMUNITY): Payer: Medicare Other

## 2021-11-17 ENCOUNTER — Inpatient Hospital Stay (HOSPITAL_COMMUNITY)
Admission: EM | Admit: 2021-11-17 | Discharge: 2021-11-25 | DRG: 521 | Disposition: A | Discharge status: Skilled Nursing Facility | Payer: Medicare Other | Source: Skilled Nursing Facility | Attending: Internal Medicine | Admitting: Internal Medicine

## 2021-11-17 ENCOUNTER — Other Ambulatory Visit: Payer: Self-pay

## 2021-11-17 ENCOUNTER — Encounter (HOSPITAL_COMMUNITY): Payer: Self-pay

## 2021-11-17 DIAGNOSIS — G9340 Encephalopathy, unspecified: Secondary | ICD-10-CM | POA: Diagnosis present

## 2021-11-17 DIAGNOSIS — S72011A Unspecified intracapsular fracture of right femur, initial encounter for closed fracture: Secondary | ICD-10-CM | POA: Diagnosis present

## 2021-11-17 DIAGNOSIS — D649 Anemia, unspecified: Secondary | ICD-10-CM

## 2021-11-17 DIAGNOSIS — H9190 Unspecified hearing loss, unspecified ear: Secondary | ICD-10-CM | POA: Diagnosis present

## 2021-11-17 DIAGNOSIS — G9341 Metabolic encephalopathy: Secondary | ICD-10-CM

## 2021-11-17 DIAGNOSIS — Y92009 Unspecified place in unspecified non-institutional (private) residence as the place of occurrence of the external cause: Secondary | ICD-10-CM

## 2021-11-17 DIAGNOSIS — W19XXXA Unspecified fall, initial encounter: Secondary | ICD-10-CM | POA: Diagnosis not present

## 2021-11-17 DIAGNOSIS — S72001A Fracture of unspecified part of neck of right femur, initial encounter for closed fracture: Secondary | ICD-10-CM | POA: Diagnosis present

## 2021-11-17 DIAGNOSIS — Z79899 Other long term (current) drug therapy: Secondary | ICD-10-CM | POA: Diagnosis not present

## 2021-11-17 DIAGNOSIS — S62101A Fracture of unspecified carpal bone, right wrist, initial encounter for closed fracture: Secondary | ICD-10-CM | POA: Diagnosis not present

## 2021-11-17 DIAGNOSIS — Z66 Do not resuscitate: Secondary | ICD-10-CM | POA: Diagnosis present

## 2021-11-17 DIAGNOSIS — Z1152 Encounter for screening for COVID-19: Secondary | ICD-10-CM

## 2021-11-17 DIAGNOSIS — Z993 Dependence on wheelchair: Secondary | ICD-10-CM | POA: Diagnosis not present

## 2021-11-17 DIAGNOSIS — J189 Pneumonia, unspecified organism: Secondary | ICD-10-CM | POA: Diagnosis present

## 2021-11-17 DIAGNOSIS — S72001P Fracture of unspecified part of neck of right femur, subsequent encounter for closed fracture with malunion: Secondary | ICD-10-CM | POA: Diagnosis not present

## 2021-11-17 DIAGNOSIS — S52501A Unspecified fracture of the lower end of right radius, initial encounter for closed fracture: Secondary | ICD-10-CM | POA: Diagnosis present

## 2021-11-17 DIAGNOSIS — I10 Essential (primary) hypertension: Secondary | ICD-10-CM | POA: Diagnosis present

## 2021-11-17 DIAGNOSIS — W1830XA Fall on same level, unspecified, initial encounter: Secondary | ICD-10-CM | POA: Diagnosis present

## 2021-11-17 DIAGNOSIS — S62101P Fracture of unspecified carpal bone, right wrist, subsequent encounter for fracture with malunion: Secondary | ICD-10-CM | POA: Diagnosis not present

## 2021-11-17 DIAGNOSIS — S52601A Unspecified fracture of lower end of right ulna, initial encounter for closed fracture: Secondary | ICD-10-CM | POA: Diagnosis present

## 2021-11-17 DIAGNOSIS — F0392 Unspecified dementia, unspecified severity, with psychotic disturbance: Secondary | ICD-10-CM | POA: Diagnosis present

## 2021-11-17 LAB — CBC WITH DIFFERENTIAL/PLATELET
Abs Immature Granulocytes: 0.06 10*3/uL (ref 0.00–0.07)
Basophils Absolute: 0 10*3/uL (ref 0.0–0.1)
Basophils Relative: 0 %
Eosinophils Absolute: 0.1 10*3/uL (ref 0.0–0.5)
Eosinophils Relative: 1 %
HCT: 33.9 % — ABNORMAL LOW (ref 36.0–46.0)
Hemoglobin: 10.7 g/dL — ABNORMAL LOW (ref 12.0–15.0)
Immature Granulocytes: 1 %
Lymphocytes Relative: 17 %
Lymphs Abs: 2.2 10*3/uL (ref 0.7–4.0)
MCH: 28.2 pg (ref 26.0–34.0)
MCHC: 31.6 g/dL (ref 30.0–36.0)
MCV: 89.4 fL (ref 80.0–100.0)
Monocytes Absolute: 0.6 10*3/uL (ref 0.1–1.0)
Monocytes Relative: 5 %
Neutro Abs: 9.8 10*3/uL — ABNORMAL HIGH (ref 1.7–7.7)
Neutrophils Relative %: 76 %
Platelets: 226 10*3/uL (ref 150–400)
RBC: 3.79 MIL/uL — ABNORMAL LOW (ref 3.87–5.11)
RDW: 17 % — ABNORMAL HIGH (ref 11.5–15.5)
WBC: 12.8 10*3/uL — ABNORMAL HIGH (ref 4.0–10.5)
nRBC: 0 % (ref 0.0–0.2)

## 2021-11-17 LAB — SURGICAL PCR SCREEN
MRSA, PCR: NEGATIVE
Staphylococcus aureus: NEGATIVE

## 2021-11-17 LAB — COMPREHENSIVE METABOLIC PANEL
ALT: 30 U/L (ref 0–44)
AST: 30 U/L (ref 15–41)
Albumin: 3.5 g/dL (ref 3.5–5.0)
Alkaline Phosphatase: 99 U/L (ref 38–126)
Anion gap: 6 (ref 5–15)
BUN: 29 mg/dL — ABNORMAL HIGH (ref 8–23)
CO2: 26 mmol/L (ref 22–32)
Calcium: 9.2 mg/dL (ref 8.9–10.3)
Chloride: 106 mmol/L (ref 98–111)
Creatinine, Ser: 0.84 mg/dL (ref 0.44–1.00)
GFR, Estimated: >60 mL/min (ref >60)
Glucose, Bld: 106 mg/dL — ABNORMAL HIGH (ref 70–99)
Potassium: 4.4 mmol/L (ref 3.5–5.1)
Sodium: 138 mmol/L (ref 135–145)
Total Bilirubin: 0.5 mg/dL (ref 0.3–1.2)
Total Protein: 7.1 g/dL (ref 6.5–8.1)

## 2021-11-17 LAB — URINALYSIS, ROUTINE W REFLEX MICROSCOPIC
Bacteria, UA: NONE SEEN
Bilirubin Urine: NEGATIVE
Glucose, UA: NEGATIVE mg/dL
Hgb urine dipstick: NEGATIVE
Ketones, ur: NEGATIVE mg/dL
Leukocytes,Ua: NEGATIVE
Nitrite: NEGATIVE
Protein, ur: 100 mg/dL — AB
Specific Gravity, Urine: 1.014 (ref 1.005–1.030)
pH: 7 (ref 5.0–8.0)

## 2021-11-17 LAB — STREP PNEUMONIAE URINARY ANTIGEN: Strep Pneumo Urinary Antigen: NEGATIVE

## 2021-11-17 LAB — RESP PANEL BY RT-PCR (FLU A&B, COVID) ARPGX2
Influenza A by PCR: NEGATIVE
Influenza B by PCR: NEGATIVE
SARS Coronavirus 2 by RT PCR: NEGATIVE

## 2021-11-17 LAB — LIPASE, BLOOD: Lipase: 35 U/L (ref 11–51)

## 2021-11-17 MED ORDER — HYDRALAZINE HCL 20 MG/ML IJ SOLN
10.0000 mg | Freq: Three times a day (TID) | INTRAMUSCULAR | Status: DC | PRN
  Administered 2021-11-23 – 2021-11-24 (×2): 10 mg via INTRAVENOUS
  Filled 2021-11-17 (×2): qty 1

## 2021-11-17 MED ORDER — CHLORHEXIDINE GLUCONATE 4 % EX LIQD
60.0000 mL | Freq: Once | CUTANEOUS | Status: AC
  Administered 2021-11-18: 4 via TOPICAL

## 2021-11-17 MED ORDER — LIDOCAINE HCL (PF) 1 % IJ SOLN: 30.0000 mL | Freq: Once | INTRAMUSCULAR | Status: DC

## 2021-11-17 MED ORDER — POVIDONE-IODINE 10 % EX SWAB
2.0000 | Freq: Once | CUTANEOUS | Status: AC
  Administered 2021-11-19: 2 via TOPICAL

## 2021-11-17 MED ORDER — HYDROCODONE-ACETAMINOPHEN 5-325 MG PO TABS
1.0000 | ORAL_TABLET | Freq: Four times a day (QID) | ORAL | Status: DC | PRN
  Administered 2021-11-17 – 2021-11-18 (×2): 1 via ORAL
  Filled 2021-11-17 (×2): qty 1
  Filled 2021-11-17: qty 2
  Filled 2021-11-17: qty 1

## 2021-11-17 MED ORDER — SODIUM CHLORIDE 0.9 % IV SOLN
500.0000 mg | Freq: Once | INTRAVENOUS | Status: AC
  Administered 2021-11-17: 500 mg via INTRAVENOUS
  Filled 2021-11-17: qty 5

## 2021-11-17 MED ORDER — SODIUM CHLORIDE 0.9 % IV SOLN
1.0000 g | Freq: Once | INTRAVENOUS | Status: AC
  Administered 2021-11-17: 1 g via INTRAVENOUS
  Filled 2021-11-17: qty 10

## 2021-11-17 MED ORDER — ALBUTEROL SULFATE (2.5 MG/3ML) 0.083% IN NEBU: 2.5000 mg | INHALATION_SOLUTION | Freq: Four times a day (QID) | RESPIRATORY_TRACT | Status: DC | PRN

## 2021-11-17 MED ORDER — GUAIFENESIN ER 600 MG PO TB12
600.0000 mg | ORAL_TABLET | Freq: Two times a day (BID) | ORAL | Status: DC
  Administered 2021-11-17 – 2021-11-25 (×9): 600 mg via ORAL
  Filled 2021-11-17 (×11): qty 1

## 2021-11-17 MED ORDER — SODIUM CHLORIDE 0.9 % IV SOLN
1.0000 g | INTRAVENOUS | Status: AC
  Administered 2021-11-18 – 2021-11-23 (×5): 1 g via INTRAVENOUS
  Filled 2021-11-17 (×6): qty 10

## 2021-11-17 MED ORDER — FENTANYL CITRATE PF 50 MCG/ML IJ SOSY
25.0000 ug | PREFILLED_SYRINGE | Freq: Once | INTRAMUSCULAR | Status: AC
  Administered 2021-11-17: 25 ug via INTRAVENOUS
  Filled 2021-11-17: qty 1

## 2021-11-17 MED ORDER — CEFAZOLIN SODIUM-DEXTROSE 2-4 GM/100ML-% IV SOLN: 2.0000 g | INTRAVENOUS | Status: AC

## 2021-11-17 MED ORDER — ACETAMINOPHEN 500 MG PO TABS: 1000.0000 mg | ORAL_TABLET | ORAL | Status: DC

## 2021-11-17 MED ORDER — SODIUM CHLORIDE 0.9 % IV SOLN
500.0000 mg | INTRAVENOUS | Status: AC
  Administered 2021-11-18 – 2021-11-23 (×5): 500 mg via INTRAVENOUS
  Filled 2021-11-17 (×6): qty 5

## 2021-11-17 MED ORDER — LABETALOL HCL 5 MG/ML IV SOLN
10.0000 mg | Freq: Once | INTRAVENOUS | Status: AC
  Administered 2021-11-17: 10 mg via INTRAVENOUS
  Filled 2021-11-17: qty 4

## 2021-11-17 MED ORDER — LACTATED RINGERS IV BOLUS
500.0000 mL | Freq: Once | INTRAVENOUS | Status: AC
  Administered 2021-11-17: 500 mL via INTRAVENOUS

## 2021-11-17 MED ORDER — MORPHINE SULFATE (PF) 2 MG/ML IV SOLN: 0.5000 mg | INTRAVENOUS | Status: DC | PRN

## 2021-11-17 MED ORDER — HYDRALAZINE HCL 25 MG PO TABS
25.0000 mg | ORAL_TABLET | Freq: Three times a day (TID) | ORAL | Status: DC
  Administered 2021-11-17 – 2021-11-25 (×12): 25 mg via ORAL
  Filled 2021-11-17 (×16): qty 1

## 2021-11-17 NOTE — Consult Note (Cosign Needed Addendum)
ORTHOPAEDIC CONSULTATION  REQUESTING PHYSICIAN: Davonna Belling, MD  Chief Complaint: left hip pain, right wrist pain and deformity  HPI: Caitlyn Braun is a 88 y.o. female with hypertension.  She presents to the [facility] emergency department today with altered mental status after an unwitnessed fall at her facility [facility].  Patient is confused on exam today, she is unable to convey where she is. She does state that her right wrist and right hip hurt, pain is worse with movement at the hip. According to chart review she has had 3 days of hallucinations and confusion.  She was trying to leave the bed and fell which was unwitnessed.  An aide came to her side when she was found down and patient was complaining of right hip and wrist pain.  She is brought to the [facility] emergency department by EMS. X-rays were taken showing a displaced right distal radius and ulnar fractures as well as right femoral neck fracture. She does have a history of a left hip hemiarthroplasty for previous fracture which she has done well with. I discussed history with her son Simona Huh who states that at baseline she does not lay in a bed but she does need significant help for mobility. She need 2 person assistance with or lift to transfer bed to wheelchair. She uses a wheelchair at baseline and is unable to navigate this on her own. Her right hand has previously been her strongest hand.    Past Medical History:  Diagnosis Date   Hearing loss    Hypertension    Past Surgical History:  Procedure Laterality Date   JOINT REPLACEMENT     Social History   Socioeconomic History   Marital status: Widowed    Spouse name: Not on file   Number of children: Not on file   Years of education: Not on file   Highest education level: Not on file  Occupational History   Not on file  Tobacco Use   Smoking status: Never   Smokeless tobacco: Never  Substance and Sexual Activity   Alcohol use: Never   Drug use:  Never   Sexual activity: Not on file  Other Topics Concern   Not on file  Social History Narrative   Not on file   Social Determinants of Health   Financial Resource Strain: Not on file  Food Insecurity: Not on file  Transportation Needs: Not on file  Physical Activity: Not on file  Stress: Not on file  Social Connections: Not on file   History reviewed. No pertinent family history. No Known Allergies Prior to Admission medications   Medication Sig Start Date End Date Taking? Authorizing Provider  acetaminophen (TYLENOL) 500 MG tablet Take 1,000 mg by mouth every 12 (twelve) hours as needed for headache.   Yes [provider]  loperamide (IMODIUM) 2 MG capsule Take 1 capsule (2 mg total) by mouth as needed for diarrhea or loose stools. 05/23/21  Yes Hosie Poisson, MD   DG Wrist Complete Right  Result Date: 11/17/2021 CLINICAL DATA:  Status post reduction of right wrist. EXAM: RIGHT WRIST - COMPLETE 3+ VIEW COMPARISON:  Right wrist radiographs earlier today FINDINGS: Assessment is limited by overlying splint material. Position and alignment of previously described distal radius and ulnar fractures is improved. There is mild residual lateral displacement of the distal ulna fracture. There is moderate residual lateral displacement, dorsal displacement, and impaction of the distal radius fracture. Soft tissue swelling is noted about the wrist.  IMPRESSION: Improved alignment of distal radius and ulnar fractures with residual displacement as above. Electronically Signed   By: Logan Bores M.D.   On: 11/17/2021 16:33   CT Head Wo Contrast  Result Date: 11/17/2021 CLINICAL DATA:  Unwitnessed fall. EXAM: CT HEAD WITHOUT CONTRAST CT CERVICAL SPINE WITHOUT CONTRAST TECHNIQUE: Multidetector CT imaging of the head and cervical spine was performed following the standard protocol without intravenous contrast. Multiplanar CT image reconstructions of the cervical spine were also generated.  RADIATION DOSE REDUCTION: This exam was performed according to the departmental dose-optimization program which includes automated exposure control, adjustment of the mA and/or kV according to patient size and/or use of iterative reconstruction technique. COMPARISON:  September 04, 2019. FINDINGS: CT HEAD FINDINGS Brain: No evidence of acute infarction, hemorrhage, hydrocephalus, extra-axial collection or mass lesion/mass effect. Vascular: No hyperdense vessel or unexpected calcification. Skull: Normal. Negative for fracture or focal lesion. Sinuses/Orbits: No acute finding. Other: None. CT CERVICAL SPINE FINDINGS Alignment: Minimal grade 1 anterolisthesis of C3-4 and C4-5 is noted secondary to posterior facet joint hypertrophy. Skull base and vertebrae: No acute fracture. No primary bone lesion or focal pathologic process. Soft tissues and spinal canal: No prevertebral fluid or swelling. No visible canal hematoma. Disc levels: Moderate degenerative disc disease is noted at C5-6 and C6-7. Upper chest: Negative. Other: Degenerative changes seen involving the bilateral facet joints. IMPRESSION: No acute intracranial abnormality seen. Multilevel degenerative changes are noted in the cervical spine. No acute abnormality seen. Electronically Signed   By: Marijo Conception M.D.   On: 11/17/2021 14:19   CT Cervical Spine Wo Contrast  Result Date: 11/17/2021 CLINICAL DATA:  Unwitnessed fall. EXAM: CT HEAD WITHOUT CONTRAST CT CERVICAL SPINE WITHOUT CONTRAST TECHNIQUE: Multidetector CT imaging of the head and cervical spine was performed following the standard protocol without intravenous contrast. Multiplanar CT image reconstructions of the cervical spine were also generated. RADIATION DOSE REDUCTION: This exam was performed according to the departmental dose-optimization program which includes automated exposure control, adjustment of the mA and/or kV according to patient size and/or use of iterative reconstruction  technique. COMPARISON:  September 04, 2019. FINDINGS: CT HEAD FINDINGS Brain: No evidence of acute infarction, hemorrhage, hydrocephalus, extra-axial collection or mass lesion/mass effect. Vascular: No hyperdense vessel or unexpected calcification. Skull: Normal. Negative for fracture or focal lesion. Sinuses/Orbits: No acute finding. Other: None. CT CERVICAL SPINE FINDINGS Alignment: Minimal grade 1 anterolisthesis of C3-4 and C4-5 is noted secondary to posterior facet joint hypertrophy. Skull base and vertebrae: No acute fracture. No primary bone lesion or focal pathologic process. Soft tissues and spinal canal: No prevertebral fluid or swelling. No visible canal hematoma. Disc levels: Moderate degenerative disc disease is noted at C5-6 and C6-7. Upper chest: Negative. Other: Degenerative changes seen involving the bilateral facet joints. IMPRESSION: No acute intracranial abnormality seen. Multilevel degenerative changes are noted in the cervical spine. No acute abnormality seen. Electronically Signed   By: Marijo Conception M.D.   On: 11/17/2021 14:19   DG Hip Unilat W or Wo Pelvis 2-3 Views Right  Result Date: 11/17/2021 CLINICAL DATA:  Unwitnessed fall. EXAM: DG HIP (WITH OR WITHOUT PELVIS) 2-3V RIGHT COMPARISON:  May 18, 2021. FINDINGS: Moderately displaced proximal right femoral neck fracture is noted. Status post left hip arthroplasty. IMPRESSION: Moderately displaced proximal right femoral neck fracture. Electronically Signed   By: Marijo Conception M.D.   On: 11/17/2021 13:47   DG Wrist Complete Right  Result Date: 11/17/2021 CLINICAL DATA:  Unwitnessed  fall. EXAM: RIGHT WRIST - COMPLETE 3+ VIEW COMPARISON:  None Available. FINDINGS: Probable comminuted distal right radial and ulnar fractures are noted with severe dorsal displacement of distal fracture fragments. IMPRESSION: Severely displaced and comminuted distal right radial and ulnar fractures. Electronically Signed   By: Marijo Conception M.D.    On: 11/17/2021 13:46   DG Chest Portable 1 View  Result Date: 11/17/2021 CLINICAL DATA:  Pt BIB [facility] form [facility] for an unwitnessed fall. Pt has deformity of the right wrist, possible injury to right hip. Splint to wrist, pelvic binding, HTNfall, hip fx, ams EXAM: PORTABLE CHEST 1 VIEW COMPARISON:  05/18/2021 FINDINGS: Normal cardiac silhouette ectatic aorta. LEFT lobe density new from comparison exam. Low lung volumes. No pneumothorax. No pulmonary edema. No acute osseous abnormality. IMPRESSION: LEFT lobe atelectasis versus infiltrate. Electronically Signed   By: Suzy Bouchard M.D.   On: 11/17/2021 13:44   Family History Reviewed and non-contributory, no pertinent history of problems with bleeding or anesthesia      Review of Systems 14 system ROS conducted and negative except for that noted in HPI   OBJECTIVE  Vitals:Patient Vitals for the past 8 hrs:  BP Temp Temp src Pulse Resp SpO2 Height Weight  11/17/21 1633 -- 98.6 F (37 C) Oral -- -- -- -- --  11/17/21 1630 (!) 184/110 -- -- 72 17 96 % -- --  11/17/21 1615 -- -- -- 69 16 99 % -- --  11/17/21 1500 (!) 168/87 -- -- 65 16 98 % -- --  11/17/21 1428 (!) 195/107 -- -- 77 18 99 % -- --  11/17/21 1300 (!) 196/94 -- -- 72 16 100 % -- --  11/17/21 1238 (!) 203/91 98.4 F (36.9 C) Oral 68 16 92 % _0  (1.6 m) 56.7 kg  11/17/21 1234 -- -- -- -- -- 99 % -- --   General: Alert, no acute distress. Cardiovascular: Warm extremities noted, no pretibial edema Respiratory: No cyanosis, no use of accessory musculature GI: No organomegaly, abdomen is soft and non-tender Skin: No lesions in the area of chief complaint other than those listed below in MSK exam.  Neurologic: Sensation intact distally save for the below mentioned MSK exam Psychiatric: Patient confused and agitated, asking to leave the hospital. Lymphatic: No swelling obvious and reported other than the area involved in the exam below  Extremities  RUE: in splint. Able  to flex and extend all fingers of hand. She endorses light touch to fingers. Fingers warm and well perfused.  MWN:UUVOZDGUY to feel DP pulse, + PT pulse. LLE shortened and externally rotated. Dorsiflexion and plantarflexion intact, able to flex and extend all toes. Patient endorses sensation to light touch.     Test Results Imaging X-rays of right wrist show displaced distal radius and ulna fractures. Post-reduction x-rays show improved alignment of distal radius and ulna fractures with continued mild-moderate displacement.  X-rays of right hip show moderately displaced right femoral neck fracture.   Labs cbc Recent Labs    11/17/21 1300  WBC 12.8*  HGB 10.7*  HCT 33.9*  PLT 226    Labs inflam No results for input(s): "CRP" in the last 72 hours.  Invalid input(s): "ESR"  Labs coag No results for input(s): "INR", "PTT" in the last 72 hours.  Invalid input(s): "PT"  Recent Labs    11/17/21 1300  NA 138  K 4.4  CL 106  CO2 26  GLUCOSE 106*  BUN 29*  CREATININE 0.84  CALCIUM  9.2    ASSESSMENT AND PLAN: 88 y.o. female with the following: Displaced comminuted right distal radius and ulna fractures Right femoral neck fracture  Called patient's son Simona Huh, spoke over the phone. He is planning to visit patient this evening. Discussed options of non-operative versus operative measures with special consideration given to patient's current palliative care status. After discussion of risks and benefits, we will plan to move forward with right hip hemiarthroplasty. We will also plan for a repeat right distal radius and ulna closed reduction while she is under anesthesia. These are really palliative procedures for pain control and to allow as much mobility as she can tolerate post-operatively.   - Plan: Operative fixation tomorrow afternoon when OR time available. - NPO at midnight  - Weight Bearing Status/Activity: bed rest, NWB RUE.  - PT/OT post op - VTE Prophylaxis: SCDs for  now - Pain control: PRN pain medications - Dispo: plan to return to [facility] with hospice care at discharge.   11/17/2021 5:06 PM

## 2021-11-17 NOTE — Discharge Instructions (Addendum)
 Dr. Brian Swinteck Joint Replacement Specialist [facility] [address] [place], [place] [address] [phone]   HIP REPLACEMENT POSTOPERATIVE DIRECTIONS    Hip Rehabilitation, Guidelines Following Surgery   WEIGHT BEARING Weight bearing as tolerated with assist device (walker, cane, etc) as directed, use it as long as suggested by your surgeon or therapist, typically at least 4-6 weeks.  The results of a hip operation are greatly improved after range of motion and muscle strengthening exercises. Follow all safety measures which are given to protect your hip. If any of these exercises cause increased pain or swelling in your joint, decrease the amount until you are comfortable again. Then slowly increase the exercises. Call your caregiver if you have problems or questions.   HOME CARE INSTRUCTIONS  Most of the following instructions are designed to prevent the dislocation of your new hip.  Remove items at home which could result in a fall. This includes throw rugs or furniture in walking pathways.  Continue medications as instructed at time of discharge. You may have some home medications which will be placed on hold until you complete the course of blood thinner medication. You may start showering once you are discharged home. Do not remove your dressing. Do not put on socks or shoes without following the instructions of your caregivers.   Sit on chairs with arms. Use the chair arms to help push yourself up when arising.  Arrange for the use of a toilet seat elevator so you are not sitting low.  Walk with walker as instructed.  You may resume a sexual relationship in one month or when given the OK by your caregiver.  Use walker as long as suggested by your caregivers.  You may put full weight on your legs and walk as much as is comfortable. Avoid periods of inactivity such as sitting longer than an hour when not asleep. This helps prevent blood clots.   You may return to work once you are cleared by your surgeon.  Do not drive a car for 6 weeks or until released by your surgeon.  Do not drive while taking narcotics.  Wear elastic stockings for two weeks following surgery during the day but you may remove then at night.  Make sure you keep all of your appointments after your operation with all of your doctors and caregivers. You should call the office at the above phone number and make an appointment for approximately two weeks after the date of your surgery. Please pick up a stool softener and laxative for home use as long as you are requiring pain medications. ICE to the affected hip every three hours for 30 minutes at a time and then as needed for pain and swelling. Continue to use ice on the hip for pain and swelling from surgery. You may notice swelling that will progress down to the foot and ankle.  This is normal after surgery.  Elevate the leg when you are not up walking on it.   It is important for you to complete the blood thinner medication as prescribed by your doctor. Continue to use the breathing machine which will help keep your temperature down.  It is common for your temperature to cycle up and down following surgery, especially at night when you are not up moving around and exerting yourself.  The breathing machine keeps your lungs expanded and your temperature down.  RANGE OF MOTION AND STRENGTHENING EXERCISES  These exercises are designed to help you keep   full movement of your hip joint. Follow your caregiver's or physical therapist's instructions. Perform all exercises about fifteen times, three times per day or as directed. Exercise both hips, even if you have had only one joint replacement. These exercises can be done on a training (exercise) mat, on the floor, on a table or on a bed. Use whatever works the best and is most comfortable for you. Use music or television while you are exercising so that the exercises are a pleasant  break in your day. This will make your life better with the exercises acting as a break in routine you can look forward to.  Lying on your back, slowly slide your foot toward your buttocks, raising your knee up off the floor. Then slowly slide your foot back down until your leg is straight again.  Lying on your back spread your legs as far apart as you can without causing discomfort.  Lying on your side, raise your upper leg and foot straight up from the floor as far as is comfortable. Slowly lower the leg and repeat.  Lying on your back, tighten up the muscle in the front of your thigh (quadriceps muscles). You can do this by keeping your leg straight and trying to raise your heel off the floor. This helps strengthen the largest muscle supporting your knee.  Lying on your back, tighten up the muscles of your buttocks both with the legs straight and with the knee bent at a comfortable angle while keeping your heel on the floor.   SKILLED REHAB INSTRUCTIONS: If the patient is transferred to a skilled rehab facility following release from the hospital, a list of the current medications will be sent to the facility for the patient to continue.  When discharged from the skilled rehab facility, please have the facility set up the patient's Home Health Physical Therapy prior to being released. Also, the skilled facility will be responsible for providing the patient with their medications at time of release from the facility to include their pain medication and their blood thinner medication. If the patient is still at the rehab facility at time of the two week follow up appointment, the skilled rehab facility will also need to assist the patient in arranging follow up appointment in our office and any transportation needs.  POST-OPERATIVE OPIOID TAPER INSTRUCTIONS: It is important to wean off of your opioid medication as soon as possible. If you do not need pain medication after your surgery it is ok to stop  day one. Opioids include: Codeine, Hydrocodone(Norco, Vicodin), Oxycodone(Percocet, oxycontin) and hydromorphone amongst others.  Long term and even short term use of opiods can cause: Increased pain response Dependence Constipation Depression Respiratory depression And more.  Withdrawal symptoms can include Flu like symptoms Nausea, vomiting And more Techniques to manage these symptoms Hydrate well Eat regular healthy meals Stay active Use relaxation techniques(deep breathing, meditating, yoga) Do Not substitute Alcohol to help with tapering If you have been on opioids for less than two weeks and do not have pain than it is ok to stop all together.  Plan to wean off of opioids This plan should start within one week post op of your joint replacement. Maintain the same interval or time between taking each dose and first decrease the dose.  Cut the total daily intake of opioids by one tablet each day Next start to increase the time between doses. The last dose that should be eliminated is the evening dose.    MAKE SURE   YOU:  Understand these instructions.  Will watch your condition.  Will get help right away if you are not doing well or get worse.  Pick up stool softner and laxative for home use following surgery while on pain medications. Do not remove your dressing. The dressing is waterproof--it is OK to take showers. Continue to use ice for pain and swelling after surgery. Do not use any lotions or creams on the incision until instructed by your surgeon. Total Hip Protocol.  

## 2021-11-17 NOTE — Progress Notes (Signed)
Orthopedic Tech Progress Note Patient Details:  Caitlyn Braun [birth year] [id number]  Dr. Billy Fischer called and placed a verbal order for finger traps and a sugartong splint. The finger traps were not used and traction alone was applied by the doctor to reduce the wrist. The splint was applied immediately after the reduction with the pt moving while applying. Mobility in the fingers were noted and tightness was not recorded. An arm sling was applied to the pt and places so their fingers were not towards the ground.     Ortho Devices Type of Ortho Device: Sugartong splint, Shoulder immobilizer Ortho Device/Splint Location: RUE Ortho Device/Splint Interventions: Ordered, Application, Adjustment   Post Interventions Patient Tolerated: Fair Instructions Provided: Adjustment of device, Care of device  Arville Go 11/17/2021, 4:01 PM

## 2021-11-17 NOTE — ED Triage Notes (Signed)
Pt BIB [facility] form [facility] for an unwitnessed fall. Pt has deformity of the right wrist, possible injury to right hip. Splint to wrist, pelvic binding, and C-collar applied by EMS

## 2021-11-17 NOTE — ED Provider Notes (Signed)
[facility] DEPT Provider Note   CSN: [id number] Arrival date & time: 11/17/21  1225     History  Chief Complaint  Patient presents with   Caitlyn Braun is a 88 y.o. female.  HPI     88yo female with history of hypertension (no longer on medications) history of anemia admission in May now on hospice presents with concern for confusion for 2-3 days, with unwitnessed fall today, hip and wrist pain.  Resides in [place] yesterday Usually she is alert but hard of hearing, wheelchair bound, one person assist to go to the bathroom, only on tylenol for pain. Friday night, Saturday she started saying that man was coming through her window and going to church--concern she was confused. Yesterday talking about construction in the attic that kept her up but she is on first floor. Seems like not sleeping the last few nights.  Normally she will wait for help to get up to use the bathroom but today got up and had the fall. They immediately heard her fall.  She knows she in in hte hospital and that she fell, when she asked more she reports something about a "grocery list".  She answers some questions reporting right hip pain, right wrist pain but appears confused sometimes talking about other things and appears sleepy. She denies headache, neck pain, chest pain, abdominal pain  No other symptoms, no n/v/d/cp/dyspnea/abd pain Denies numbness, weakness, difficulty talking or new difficulty walking, visual changes or facial droop.    Last hospitalization needed blood, put on hospice. Usually able to verbalize needs, may be forgetful but gets it, Always gets up at 6AM, routined, normally alert throughout the day   Normally takes a long time to get up and move but tried to get up on her own 1130 had the fall-aid heard it and went to her  EMS reports she had hypoxia at facility prior to them giving her fentanyl.   Past  Medical History:  Diagnosis Date   Hearing loss    Hypertension      Home Medications Prior to Admission medications   Medication Sig Start Date End Date Taking? Authorizing Provider  acetaminophen (TYLENOL) 500 MG tablet Take 1,000 mg by mouth every 12 (twelve) hours as needed for headache.   Yes [provider]  loperamide (IMODIUM) 2 MG capsule Take 1 capsule (2 mg total) by mouth as needed for diarrhea or loose stools. 05/23/21  Yes Hosie Poisson, MD      Allergies    Patient has no known allergies.    Review of Systems   Review of Systems  Physical Exam Updated Vital Signs BP (!) 163/82 (BP Location: Left Arm)   Pulse 75   Temp 97.6 F (36.4 C) (Oral)   Resp 18   Ht 5\' 3"  (1.6 m)   Wt 56.7 kg   SpO2 93%   BMI 22.14 kg/m  Physical Exam Vitals and nursing note reviewed.  Constitutional:      General: She is not in acute distress.    Appearance: She is well-developed. She is not diaphoretic.     Comments: Sleepy Able to state name, location in hospital   HENT:     Head: Normocephalic and atraumatic.  Eyes:     Conjunctiva/sclera: Conjunctivae normal.  Cardiovascular:     Rate and Rhythm: Normal rate and regular rhythm.     Heart sounds: Normal heart sounds. No murmur heard.  No friction rub. No gallop.  Pulmonary:     Effort: Pulmonary effort is normal. No respiratory distress.     Breath sounds: Normal breath sounds. No wheezing or rales.  Chest:     Chest wall: No tenderness.  Abdominal:     General: There is no distension.     Palpations: Abdomen is soft.     Tenderness: There is no abdominal tenderness. There is no guarding.  Musculoskeletal:        General: Swelling, tenderness (right hip, externally rotated, right wrist with deformity, forearm contusion, normal pulses), deformity and signs of injury present.  Skin:    General: Skin is warm and dry.     Findings: No erythema or rash.  Neurological:     Mental Status: She is oriented to  person, place, and time.     ED Results / Procedures / Treatments   Labs (all labs ordered are listed, but only abnormal results are displayed) Labs Reviewed  CBC WITH DIFFERENTIAL/PLATELET - Abnormal; Notable for the following components:      Result Value   WBC 12.8 (*)    RBC 3.79 (*)    Hemoglobin 10.7 (*)    HCT 33.9 (*)    RDW 17.0 (*)    Neutro Abs 9.8 (*)    All other components within normal limits  COMPREHENSIVE METABOLIC PANEL - Abnormal; Notable for the following components:   Glucose, Bld 106 (*)    BUN 29 (*)    All other components within normal limits  RESP PANEL BY RT-PCR (FLU A&B, COVID) ARPGX2  URINE CULTURE  SURGICAL PCR SCREEN  LIPASE, BLOOD  URINALYSIS, ROUTINE W REFLEX MICROSCOPIC  LEGIONELLA PNEUMOPHILA SEROGP 1 UR AG  STREP PNEUMONIAE URINARY ANTIGEN    EKG EKG Interpretation  Date/Time:  Monday November 17 2021 13:11:51 EDT Ventricular Rate:  72 PR Interval:  413 QRS Duration: 94 QT Interval:  380 QTC Calculation: 416 R Axis:   47 Text Interpretation: Sinus or ectopic atrial rhythm Prolonged PR interval Borderline repolarization abnormality Baseline wander in lead(s) V1 Nonspecific ST changes compared to prior Confirmed by Alvira Monday ([id number]) on 11/17/2021 3:47:51 PM  Radiology DG Wrist Complete Right  Result Date: 11/17/2021 CLINICAL DATA:  Status post reduction of right wrist. EXAM: RIGHT WRIST - COMPLETE 3+ VIEW COMPARISON:  Right wrist radiographs earlier today FINDINGS: Assessment is limited by overlying splint material. Position and alignment of previously described distal radius and ulnar fractures is improved. There is mild residual lateral displacement of the distal ulna fracture. There is moderate residual lateral displacement, dorsal displacement, and impaction of the distal radius fracture. Soft tissue swelling is noted about the wrist. IMPRESSION: Improved alignment of distal radius and ulnar fractures with residual  displacement as above. Electronically Signed   By: Sebastian Ache M.D.   On: 11/17/2021 16:33   CT Head Wo Contrast  Result Date: 11/17/2021 CLINICAL DATA:  Unwitnessed fall. EXAM: CT HEAD WITHOUT CONTRAST CT CERVICAL SPINE WITHOUT CONTRAST TECHNIQUE: Multidetector CT imaging of the head and cervical spine was performed following the standard protocol without intravenous contrast. Multiplanar CT image reconstructions of the cervical spine were also generated. RADIATION DOSE REDUCTION: This exam was performed according to the departmental dose-optimization program which includes automated exposure control, adjustment of the mA and/or kV according to patient size and/or use of iterative reconstruction technique. COMPARISON:  September 04, 2019. FINDINGS: CT HEAD FINDINGS Brain: No evidence of acute infarction, hemorrhage, hydrocephalus, extra-axial collection or mass lesion/mass effect.  Vascular: No hyperdense vessel or unexpected calcification. Skull: Normal. Negative for fracture or focal lesion. Sinuses/Orbits: No acute finding. Other: None. CT CERVICAL SPINE FINDINGS Alignment: Minimal grade 1 anterolisthesis of C3-4 and C4-5 is noted secondary to posterior facet joint hypertrophy. Skull base and vertebrae: No acute fracture. No primary bone lesion or focal pathologic process. Soft tissues and spinal canal: No prevertebral fluid or swelling. No visible canal hematoma. Disc levels: Moderate degenerative disc disease is noted at C5-6 and C6-7. Upper chest: Negative. Other: Degenerative changes seen involving the bilateral facet joints. IMPRESSION: No acute intracranial abnormality seen. Multilevel degenerative changes are noted in the cervical spine. No acute abnormality seen. Electronically Signed   By: Lupita Raider M.D.   On: 11/17/2021 14:19   CT Cervical Spine Wo Contrast  Result Date: 11/17/2021 CLINICAL DATA:  Unwitnessed fall. EXAM: CT HEAD WITHOUT CONTRAST CT CERVICAL SPINE WITHOUT CONTRAST  TECHNIQUE: Multidetector CT imaging of the head and cervical spine was performed following the standard protocol without intravenous contrast. Multiplanar CT image reconstructions of the cervical spine were also generated. RADIATION DOSE REDUCTION: This exam was performed according to the departmental dose-optimization program which includes automated exposure control, adjustment of the mA and/or kV according to patient size and/or use of iterative reconstruction technique. COMPARISON:  September 04, 2019. FINDINGS: CT HEAD FINDINGS Brain: No evidence of acute infarction, hemorrhage, hydrocephalus, extra-axial collection or mass lesion/mass effect. Vascular: No hyperdense vessel or unexpected calcification. Skull: Normal. Negative for fracture or focal lesion. Sinuses/Orbits: No acute finding. Other: None. CT CERVICAL SPINE FINDINGS Alignment: Minimal grade 1 anterolisthesis of C3-4 and C4-5 is noted secondary to posterior facet joint hypertrophy. Skull base and vertebrae: No acute fracture. No primary bone lesion or focal pathologic process. Soft tissues and spinal canal: No prevertebral fluid or swelling. No visible canal hematoma. Disc levels: Moderate degenerative disc disease is noted at C5-6 and C6-7. Upper chest: Negative. Other: Degenerative changes seen involving the bilateral facet joints. IMPRESSION: No acute intracranial abnormality seen. Multilevel degenerative changes are noted in the cervical spine. No acute abnormality seen. Electronically Signed   By: Lupita Raider M.D.   On: 11/17/2021 14:19   DG Hip Unilat W or Wo Pelvis 2-3 Views Right  Result Date: 11/17/2021 CLINICAL DATA:  Unwitnessed fall. EXAM: DG HIP (WITH OR WITHOUT PELVIS) 2-3V RIGHT COMPARISON:  May 18, 2021. FINDINGS: Moderately displaced proximal right femoral neck fracture is noted. Status post left hip arthroplasty. IMPRESSION: Moderately displaced proximal right femoral neck fracture. Electronically Signed   By: Lupita Raider M.D.   On: 11/17/2021 13:47   DG Wrist Complete Right  Result Date: 11/17/2021 CLINICAL DATA:  Unwitnessed fall. EXAM: RIGHT WRIST - COMPLETE 3+ VIEW COMPARISON:  None Available. FINDINGS: Probable comminuted distal right radial and ulnar fractures are noted with severe dorsal displacement of distal fracture fragments. IMPRESSION: Severely displaced and comminuted distal right radial and ulnar fractures. Electronically Signed   By: Lupita Raider M.D.   On: 11/17/2021 13:46   DG Chest Portable 1 View  Result Date: 11/17/2021 CLINICAL DATA:  Pt BIB [facility] form [facility] for an unwitnessed fall. Pt has deformity of the right wrist, possible injury to right hip. Splint to wrist, pelvic binding, HTNfall, hip fx, ams EXAM: PORTABLE CHEST 1 VIEW COMPARISON:  05/18/2021 FINDINGS: Normal cardiac silhouette ectatic aorta. LEFT lobe density new from comparison exam. Low lung volumes. No pneumothorax. No pulmonary edema. No acute osseous abnormality. IMPRESSION: LEFT lobe atelectasis versus infiltrate.  Electronically Signed   By: Genevive BiStewart  Edmunds M.D.   On: 11/17/2021 13:44    Procedures Procedures    Medications Ordered in ED Medications  lidocaine (PF) (XYLOCAINE) 1 % injection 30 mL (0 mLs Intradermal Hold 11/17/21 1500)  chlorhexidine (HIBICLENS) 4 % liquid 4 Application (has no administration in time range)  povidone-iodine 10 % swab 2 Application (has no administration in time range)  ceFAZolin (ANCEF) IVPB 2g/100 mL premix (has no administration in time range)  acetaminophen (TYLENOL) tablet 1,000 mg (has no administration in time range)  hydrALAZINE (APRESOLINE) injection 10 mg (has no administration in time range)  hydrALAZINE (APRESOLINE) tablet 25 mg (25 mg Oral Given 11/17/21 2006)  HYDROcodone-acetaminophen (NORCO/VICODIN) 5-325 MG per tablet 1-2 tablet (1 tablet Oral Given 11/17/21 2006)  morphine (PF) 2 MG/ML injection 0.5 mg (has no administration in time range)  cefTRIAXone  (ROCEPHIN) 1 g in sodium chloride 0.9 % 100 mL IVPB (has no administration in time range)  azithromycin (ZITHROMAX) 500 mg in sodium chloride 0.9 % 250 mL IVPB (has no administration in time range)  guaiFENesin (MUCINEX) 12 hr tablet 600 mg (600 mg Oral Given 11/17/21 2006)  albuterol (PROVENTIL) (2.5 MG/3ML) 0.083% nebulizer solution 2.5 mg (has no administration in time range)  cefTRIAXone (ROCEPHIN) 1 g in sodium chloride 0.9 % 100 mL IVPB (0 g Intravenous Stopped 11/17/21 1501)  azithromycin (ZITHROMAX) 500 mg in sodium chloride 0.9 % 250 mL IVPB (0 mg Intravenous Stopped 11/17/21 1551)  lactated ringers bolus 500 mL (0 mLs Intravenous Stopped 11/17/21 1600)  labetalol (NORMODYNE) injection 10 mg (10 mg Intravenous Given 11/17/21 1442)  fentaNYL (SUBLIMAZE) injection 25 mcg (25 mcg Intravenous Given 11/17/21 1442)    ED Course/ Medical Decision Making/ A&P                           Medical Decision Making Amount and/or Complexity of Data Reviewed Labs: ordered. Radiology: ordered.  Risk Prescription drug management. Decision regarding hospitalization.   88yo female with history of hypertension (no longer on medications) history of anemia admission in May now on hospice presents with concern for confusion for 2-3 days, with unwitnessed fall today, hip and wrist pain.   Regarding altered mental status---Differential diagnosis includes anemia, electrolyte abnormality, cardiac abnormality, infection, other toxic/metabolic abnormalities.  Described symptoms of confusion, no focal neurologic symptoms or findings (although ride sided strength eval affected by injuries).  CT head completed and personally read interpreted by me show no evidence of intracranial hemorrhage.   Chest x-ray shows patchy left lower lobe opacity, concern for possible pneumonia.  Suspect this may be etiology of her delirium.  Given rocephin/azithromycin.  No hx of cough or dyspnea although EMS reported she was  hypoxic prior to giving her medications.  No chest wall tenderness, doubt thoracic trauma or pneumothorax.  UA is still pending as another possiblie etiology.  Regarding her fall: CT head without ICH, CT cervical spine evaluated by radiology shows no evidence of fracture.  XR right hip with fracture. Discussed with Dr. Dion SaucierLandau.   XR right wrist with severely comminuted and displaced fracture.  She was reduced after given fentanyl, post reduction pending at time of transfer of care. Discussed with Dr. Frazier ButtBenfield.   Updated son regarding care.              Final Clinical Impression(s) / ED Diagnoses Final diagnoses:  Fall, initial encounter  Closed fracture of right wrist, initial encounter  Closed  fracture of right hip, initial encounter [facility])  Community acquired pneumonia of left lower lobe of lung  Encephalopathy    Rx / DC Orders ED Discharge Orders     None         Alvira Monday, MD 11/17/21 2127

## 2021-11-17 NOTE — H&P (Signed)
History and Physical    Patient: Caitlyn Braun:[id number] DOB: [birth year] DOA: 11/17/2021 DOS: the patient was seen and examined on 11/17/2021 PCP: Patient, No Pcp Per  Patient coming from: SNF  Chief Complaint:  Chief Complaint  Patient presents with   Fall   HPI: Caitlyn Braun is a 88 y.o. female with medical history significant of HTN. Presenting with AMS after unwitnessed fall. Patient is confused. History is from chart review as I am unable to contact family. She apparently is on home hospice. Apparently 3 days ago she began having hallucinations and confusion. This morning, she was out of her normal routine of getting up at 6AM. Around 1130AM, she was trying to leave the bed, but she fell. It was unwitnessed. An aide heard a thump and rushed to her. She was found down and complaining of right hip and right wrist pain. EMS was alerted.   Review of Systems: unable to review all systems due to the inability of the patient to answer questions. Past Medical History:  Diagnosis Date   Hearing loss    Hypertension    Past Surgical History:  Procedure Laterality Date   JOINT REPLACEMENT     Social History:  reports that she has never smoked. She has never used smokeless tobacco. She reports that she does not drink alcohol and does not use drugs.  No Known Allergies  FamHx History reviewed. No pertinent family history.  Prior to Admission medications   Medication Sig Start Date End Date Taking? Authorizing Provider  acetaminophen (TYLENOL) 500 MG tablet Take 1,000 mg by mouth every 12 (twelve) hours as needed for headache.   Yes [provider]  loperamide (IMODIUM) 2 MG capsule Take 1 capsule (2 mg total) by mouth as needed for diarrhea or loose stools. 05/23/21  Yes Kathlen Mody, MD    Physical Exam: Vitals:   11/17/21 1234 11/17/21 1238 11/17/21 1300 11/17/21 1428  BP:  (!) 203/91 (!) 196/94 (!) 195/107  Pulse:  68 72 77  Resp:  16 16 18   Temp:  98.4 F  (36.9 C)    TempSrc:  Oral    SpO2: 99% 92% 100% 99%  Weight:  56.7 kg    Height:  5\' 3"  (1.6 m)     General: 88 y.o. female resting in bed in NAD Eyes: PERRL, normal sclera ENMT: Nares patent w/o discharge, orophaynx clear, dentition normal, ears w/o discharge/lesions/ulcers Neck: Supple, trachea midline Cardiovascular: RRR, +S1, S2, no m/g/r, equal pulses throughout Respiratory: decreased at bases, no w/r/r, normal WOB GI: BS+, NDNT, no masses noted, no organomegaly noted MSK: No e/c/c; right arm in sling, ROM right hip limited d/t pain Neuro: A&O x 1, she is confused and having difficulty following commands  Data Reviewed:  Results for orders placed or performed during the hospital encounter of 11/17/21 (from the past 24 hour(s))  CBC with Differential     Status: Abnormal   Collection Time: 11/17/21  1:00 PM  Result Value Ref Range   WBC 12.8 (H) 4.0 - 10.5 K/uL   RBC 3.79 (L) 3.87 - 5.11 MIL/uL   Hemoglobin 10.7 (L) 12.0 - 15.0 g/dL   HCT 11/19/21 (L) 11/19/21 - 54.6 %   MCV 89.4 80.0 - 100.0 fL   MCH 28.2 26.0 - 34.0 pg   MCHC 31.6 30.0 - 36.0 g/dL   RDW 56.8 (H) 12.7 - 51.7 %   Platelets 226 150 - 400 K/uL   nRBC 0.0 0.0 - 0.2 %  Neutrophils Relative % 76 %   Neutro Abs 9.8 (H) 1.7 - 7.7 K/uL   Lymphocytes Relative 17 %   Lymphs Abs 2.2 0.7 - 4.0 K/uL   Monocytes Relative 5 %   Monocytes Absolute 0.6 0.1 - 1.0 K/uL   Eosinophils Relative 1 %   Eosinophils Absolute 0.1 0.0 - 0.5 K/uL   Basophils Relative 0 %   Basophils Absolute 0.0 0.0 - 0.1 K/uL   Immature Granulocytes 1 %   Abs Immature Granulocytes 0.06 0.00 - 0.07 K/uL  Comprehensive metabolic panel     Status: Abnormal   Collection Time: 11/17/21  1:00 PM  Result Value Ref Range   Sodium 138 135 - 145 mmol/L   Potassium 4.4 3.5 - 5.1 mmol/L   Chloride 106 98 - 111 mmol/L   CO2 26 22 - 32 mmol/L   Glucose, Bld 106 (H) 70 - 99 mg/dL   BUN 29 (H) 8 - 23 mg/dL   Creatinine, Ser 8.93 0.44 - 1.00 mg/dL   Calcium  9.2 8.9 - 81.0 mg/dL   Total Protein 7.1 6.5 - 8.1 g/dL   Albumin 3.5 3.5 - 5.0 g/dL   AST 30 15 - 41 U/L   ALT 30 0 - 44 U/L   Alkaline Phosphatase 99 38 - 126 U/L   Total Bilirubin 0.5 0.3 - 1.2 mg/dL   GFR, Estimated >17 >51 mL/min   Anion gap 6 5 - 15  Lipase, blood     Status: None   Collection Time: 11/17/21  1:00 PM  Result Value Ref Range   Lipase 35 11 - 51 U/L  Resp Panel by RT-PCR (Flu A&B, Covid) Anterior Nasal Swab     Status: None   Collection Time: 11/17/21  1:45 PM   Specimen: Anterior Nasal Swab  Result Value Ref Range   SARS Coronavirus 2 by RT PCR NEGATIVE NEGATIVE   Influenza A by PCR NEGATIVE NEGATIVE   Influenza B by PCR NEGATIVE NEGATIVE   CXR: LEFT lobe atelectasis versus infiltrate.  XR right wrist: Severely displaced and comminuted distal right radial and ulnar fractures.  CTH/CT c-spine: No acute intracranial abnormality seen.  Multilevel degenerative changes are noted in the cervical spine. No acute abnormality seen.  XR Hip Moderately displaced proximal right femoral neck fracture.  EKG: sinus, no st elevation; 1st degree HB  Assessment and Plan: LLL PNA     - admit to inpt, tele     - COVID/flu negative     - continue CAP coverage     - PRN nebs     - guaifenesin     - wean O2 as able  Right hip fracture Right wrist fracture Fall     - hand surgery and orthopedics to evaluate     - awaiting family discussion about options as the patient is w/ hospice     - PT/OT after any procedure  HTN     - does not use any meds at home     - hydralazine while here  Acute metabolic encephalopathy     - secondary to infection  Normocytic anemia     - no evidence of bleed     - follow  Advance Care Planning:   Code Status: DNR  Consults: Hand surgery, Orthopedics  Family Communication: Attempted called to son listed in chart, received VM only.  Severity of Illness: The appropriate patient status for this patient is INPATIENT.  Inpatient status is judged to be reasonable  and necessary in order to provide the required intensity of service to ensure the patient's safety. The patient's presenting symptoms, physical exam findings, and initial radiographic and laboratory data in the context of their chronic comorbidities is felt to place them at high risk for further clinical deterioration. Furthermore, it is not anticipated that the patient will be medically stable for discharge from the hospital within 2 midnights of admission.   * I certify that at the point of admission it is my clinical judgment that the patient will require inpatient hospital care spanning beyond 2 midnights from the point of admission due to high intensity of service, high risk for further deterioration and high frequency of surveillance required.*  Author: Jonnie Finner, DO 11/17/2021 3:03 PM  For on call review [email or web address].

## 2021-11-18 DIAGNOSIS — D649 Anemia, unspecified: Secondary | ICD-10-CM

## 2021-11-18 DIAGNOSIS — S62101A Fracture of unspecified carpal bone, right wrist, initial encounter for closed fracture: Secondary | ICD-10-CM

## 2021-11-18 DIAGNOSIS — S72001P Fracture of unspecified part of neck of right femur, subsequent encounter for closed fracture with malunion: Secondary | ICD-10-CM | POA: Diagnosis not present

## 2021-11-18 DIAGNOSIS — I10 Essential (primary) hypertension: Secondary | ICD-10-CM

## 2021-11-18 DIAGNOSIS — J189 Pneumonia, unspecified organism: Secondary | ICD-10-CM | POA: Diagnosis not present

## 2021-11-18 DIAGNOSIS — G9341 Metabolic encephalopathy: Secondary | ICD-10-CM

## 2021-11-18 DIAGNOSIS — S62101P Fracture of unspecified carpal bone, right wrist, subsequent encounter for fracture with malunion: Secondary | ICD-10-CM

## 2021-11-18 LAB — BASIC METABOLIC PANEL
Anion gap: 9 (ref 5–15)
BUN: 35 mg/dL — ABNORMAL HIGH (ref 8–23)
CO2: 26 mmol/L (ref 22–32)
Calcium: 9.5 mg/dL (ref 8.9–10.3)
Chloride: 103 mmol/L (ref 98–111)
Creatinine, Ser: 0.78 mg/dL (ref 0.44–1.00)
GFR, Estimated: >60 mL/min (ref >60)
Glucose, Bld: 112 mg/dL — ABNORMAL HIGH (ref 70–99)
Potassium: 4.7 mmol/L (ref 3.5–5.1)
Sodium: 138 mmol/L (ref 135–145)

## 2021-11-18 LAB — MAGNESIUM: Magnesium: 1.8 mg/dL (ref 1.7–2.4)

## 2021-11-18 LAB — CBC WITH DIFFERENTIAL/PLATELET
Abs Immature Granulocytes: 0.05 10*3/uL (ref 0.00–0.07)
Basophils Absolute: 0 10*3/uL (ref 0.0–0.1)
Basophils Relative: 0 %
Eosinophils Absolute: 0.1 10*3/uL (ref 0.0–0.5)
Eosinophils Relative: 1 %
HCT: 33.9 % — ABNORMAL LOW (ref 36.0–46.0)
Hemoglobin: 11 g/dL — ABNORMAL LOW (ref 12.0–15.0)
Immature Granulocytes: 0 %
Lymphocytes Relative: 14 %
Lymphs Abs: 1.8 10*3/uL (ref 0.7–4.0)
MCH: 28.4 pg (ref 26.0–34.0)
MCHC: 32.4 g/dL (ref 30.0–36.0)
MCV: 87.4 fL (ref 80.0–100.0)
Monocytes Absolute: 0.8 10*3/uL (ref 0.1–1.0)
Monocytes Relative: 6 %
Neutro Abs: 10.4 10*3/uL — ABNORMAL HIGH (ref 1.7–7.7)
Neutrophils Relative %: 79 %
Platelets: 212 10*3/uL (ref 150–400)
RBC: 3.88 MIL/uL (ref 3.87–5.11)
RDW: 16.9 % — ABNORMAL HIGH (ref 11.5–15.5)
WBC: 13.1 10*3/uL — ABNORMAL HIGH (ref 4.0–10.5)
nRBC: 0 % (ref 0.0–0.2)

## 2021-11-18 LAB — TYPE AND SCREEN
ABO/RH(D): A NEG
Antibody Screen: NEGATIVE

## 2021-11-18 MED ORDER — SODIUM CHLORIDE 0.9 % IV SOLN: INTRAVENOUS | Status: AC

## 2021-11-18 MED ORDER — ACETAMINOPHEN 500 MG PO TABS
1000.0000 mg | ORAL_TABLET | ORAL | Status: DC
  Filled 2021-11-18: qty 2

## 2021-11-18 MED ORDER — ENSURE ENLIVE PO LIQD: 237.0000 mL | ORAL | Status: DC

## 2021-11-18 MED ORDER — ORAL CARE MOUTH RINSE: 15.0000 mL | OROMUCOSAL | Status: DC | PRN

## 2021-11-18 NOTE — Progress Notes (Addendum)
Initial Nutrition Assessment  DOCUMENTATION CODES:  Not applicable  INTERVENTION:  -Continue Dysphagia 3 diet as tolerated -Provide Ensure Plus HP q day (350kcal and 20g protein) -Complete NFPE on follow up  -Consider MVI q day  NUTRITION DIAGNOSIS:  Increased nutrient needs related to post-op healing as evidenced by estimated needs (increased metabolic need for healing).  GOAL:  Patient will meet greater than or equal to 90% of their needs  MONITOR:  PO intake, Supplement acceptance  REASON FOR ASSESSMENT:  Consult Hip fracture protocol  ASSESSMENT:  Pt is a 88yo F with PMH of HTN and hearing loss who presents with AMS after an unwitnessed fall at nursing facility.  Attempted visit x 3 today, no family present, pt NPO and with AMS. Pt planned for operative fixation of hip fracture tomorrow (surgery delayed). Diet now advanced to dysphagia 3 with NPO at midnight. Recommend also providing Ensure Plus HP q day to increase protein and calorie needs (350kcal and 20g protein/bottle). Pt may also benefit from MVI. RD to complete NFPE on follow up and assess po intake.  Medications reviewed  Labs reviewed: BG:106, BUN:29, B12:128 in April   NUTRITION - FOCUSED PHYSICAL EXAM: Deferred until follow up  Diet Order:   Diet Order             Diet NPO time specified Except for: Sips with Meds  Diet effective midnight           DIET DYS 3 Room service appropriate? Yes; Fluid consistency: Thin  Diet effective now                   EDUCATION NEEDS:  Not appropriate for education at this time  Skin:  Skin Assessment: Reviewed RN Assessment  Last BM:  10/29  Height:  Ht Readings from Last 1 Encounters:  11/17/21 5\' 3"  (1.6 m)    Weight:  Wt Readings from Last 1 Encounters:  11/17/21 60.3 kg    BMI:  Body mass index is 23.55 kg/m.  Estimated Nutritional Needs:   Kcal:  1510-1810kcal  Protein:  70-90g  Fluid:  1510-1815mL  Candise Bowens, MS, RD, LDN,  CNSC See AMiON for contact information

## 2021-11-18 NOTE — Progress Notes (Signed)
PROGRESS NOTE    Caitlyn Braun  YNW:[id number] DOB: [birth year] DOA: 11/17/2021 PCP: Patient, No Pcp Per    Chief Complaint  Patient presents with   Fall    Brief Narrative:  Patient 88 year old female history of hypertension, hearing loss, noted to apparently be home with hospice presenting with altered mental status and unwitnessed falls.  Patient unable to provide history per admitting physician and information taken from the chart which is noted that patient did have some hallucinations and confusions 3 days prior to admission.  Patient noted to have tried to leave her bed had an unwitnessed fall with complaints of right hip pain and right wrist pain EMS alerted brought to the ED.  Imaging done consistent with proximal right femoral neck fracture, severely displaced and commuted distal right radial and ulnar fractures.  Chest x-ray concerning for atelectasis versus infiltrate.  Patient placed empirically on IV antibiotics, admitted, orthopedics consulted who spoke with patient's son over the phone and plan was right hip hemiarthroplasty, repeat right distal radius and ulna closed reduction while under anesthesia for palliative purposes for pain control and to allow mobility.   Assessment & Plan:   Principal Problem:   Closed right hip fracture (HCC) Active Problems:   Normocytic anemia   Essential hypertension   PNA (pneumonia)   Right wrist fracture   Fall at home, initial encounter   Acute metabolic encephalopathy   Community acquired pneumonia of left lower lobe of lung  #1 proximal right femoral neck fracture/severely displaced and commuted distal right radial and ulnar fractures -Secondary to mechanical fall. -Patient seen in consultation by orthopedics, will discussed with family over the telephone and decision made for right hip hemiarthroplasty, repeat right distal radius and ulna closed reduction while under anesthesia for palliative purposes for pain control and to  allow mobility. -Gentle hydration with IV fluids. -Pain management per orthopedics.  2.  Left lower lobe pneumonia -Chest x-ray done concerning for atelectasis versus infiltrate. -Noted with a leukocytosis. -Continue empiric IV Rocephin, IV azithromycin for coverage for pneumonia. -Continue Mucinex.  3.  Hypertension -Not on medications per med rec. -Continue current regimen of hydralazine 25 mg 3 times daily.  4.  Acute metabolic encephalopathy -Likely secondary to left lower lobe pneumonia in the setting of pain from right femoral neck fracture and distal right radial and ulnar fractures. -Continue empiric antibiotics. -Gentle hydration. -Supportive care.  5.  Normocytic anemia -No overt bleeding. -Follow H&H.    DVT prophylaxis: SCDs.  Postop per orthopedics. Code Status: DNR Family Communication: No family at bedside. Disposition: Likely back to SNF and hospice following  Status is: Inpatient Remains inpatient appropriate because: Severity of illness   Consultants:  Orthopedics: Dr. Dion Saucier 11/17/2021  Procedures:  CT head CT C-spine 11/17/2021 Chest x-ray 11/17/2021 Plain films of the right wrist 11/17/2021 Plain films of the right hip and pelvis 11/17/2021  Antimicrobials:  Anti-infectives (From admission, onward)    Start     Dose/Rate Route Frequency Ordered Stop   11/18/21 1400  cefTRIAXone (ROCEPHIN) 1 g in sodium chloride 0.9 % 100 mL IVPB        1 g 200 mL/hr over 30 Minutes Intravenous Every 24 hours 11/17/21 1855     11/18/21 1400  azithromycin (ZITHROMAX) 500 mg in sodium chloride 0.9 % 250 mL IVPB        500 mg 250 mL/hr over 60 Minutes Intravenous Every 24 hours 11/17/21 1855     11/18/21 0600  ceFAZolin (ANCEF) IVPB 2g/100  mL premix        2 g 200 mL/hr over 30 Minutes Intravenous On call to O.R. 11/17/21 2034 11/19/21 0559   11/17/21 1445  cefTRIAXone (ROCEPHIN) 1 g in sodium chloride 0.9 % 100 mL IVPB        1 g 200 mL/hr over 30 Minutes  Intravenous  Once 11/17/21 1434 11/17/21 1501   11/17/21 1445  azithromycin (ZITHROMAX) 500 mg in sodium chloride 0.9 % 250 mL IVPB        500 mg 250 mL/hr over 60 Minutes Intravenous  Once 11/17/21 1434 11/17/21 1551         Subjective: Demented. In bed.  Seems nonverbal.  Opens eyes to verbal stimuli.  Objective: Vitals:   11/18/21 0223 11/18/21 0522 11/18/21 0927 11/18/21 1404  BP: (!) 171/68 (!) 151/76 (!) 156/80 (!) 147/66  Pulse: 75 79 73 81  Resp: 18 18 17 17   Temp: (!) 97.3 F (36.3 C) 98 F (36.7 C) (!) 97.5 F (36.4 C) 98.6 F (37 C)  TempSrc: Axillary  Oral   SpO2: 90% 100% 99% 93%  Weight:      Height:        Intake/Output Summary (Last 24 hours) at 11/18/2021 1533 Last data filed at 11/18/2021 0816 Gross per 24 hour  Intake 1175.55 ml  Output 950 ml  Net 225.55 ml   Filed Weights   11/17/21 1238 11/17/21 2005  Weight: 56.7 kg 60.3 kg    Examination:  General exam: Appears calm and comfortable.  Dry mucous membranes. Respiratory system: Clear to auscultation anterior lung fields.  No wheezes, no crackles, no rhonchi.  Fair air movement.Marland Kitchen. Respiratory effort normal. Cardiovascular system: S1 & S2 heard, RRR. No JVD, murmurs, rubs, gallops or clicks. No pedal edema. Gastrointestinal system: Abdomen is nondistended, soft and nontender. No organomegaly or masses felt. Normal bowel sounds heard. Central nervous system: Alert and oriented. No focal neurological deficits. Extremities: RLE externally rotated and shortened.  Right upper extremity in splint. Skin: No rashes, lesions or ulcers Psychiatry: Judgement and insight appear normal. Mood & affect appropriate.     Data Reviewed: I have personally reviewed following labs and imaging studies  CBC: Recent Labs  Lab 11/17/21 1300 11/18/21 0849  WBC 12.8* 13.1*  NEUTROABS 9.8* 10.4*  HGB 10.7* 11.0*  HCT 33.9* 33.9*  MCV 89.4 87.4  PLT 226 212    Basic Metabolic Panel: Recent Labs  Lab  11/17/21 1300 11/18/21 0849  NA 138 138  K 4.4 4.7  CL 106 103  CO2 26 26  GLUCOSE 106* 112*  BUN 29* 35*  CREATININE 0.84 0.78  CALCIUM 9.2 9.5  MG  --  1.8    GFR: Estimated Creatinine Clearance: 33.3 mL/min (by C-G formula based on SCr of 0.78 mg/dL).  Liver Function Tests: Recent Labs  Lab 11/17/21 1300  AST 30  ALT 30  ALKPHOS 99  BILITOT 0.5  PROT 7.1  ALBUMIN 3.5    CBG: No results for input(s): "GLUCAP" in the last 168 hours.   Recent Results (from the past 240 hour(s))  Resp Panel by RT-PCR (Flu A&B, Covid) Anterior Nasal Swab     Status: None   Collection Time: 11/17/21  1:45 PM   Specimen: Anterior Nasal Swab  Result Value Ref Range Status   SARS Coronavirus 2 by RT PCR NEGATIVE NEGATIVE Final    Comment: (NOTE) SARS-CoV-2 target nucleic acids are NOT DETECTED.  The SARS-CoV-2 RNA is generally detectable in upper  respiratory specimens during the acute phase of infection. The lowest concentration of SARS-CoV-2 viral copies this assay can detect is 138 copies/mL. A negative result does not preclude SARS-Cov-2 infection and should not be used as the sole basis for treatment or other patient management decisions. A negative result may occur with  improper specimen collection/handling, submission of specimen other than nasopharyngeal swab, presence of viral mutation(s) within the areas targeted by this assay, and inadequate number of viral copies(<138 copies/mL). A negative result must be combined with clinical observations, patient history, and epidemiological information. The expected result is Negative.  Fact Sheet for Patients:  EntrepreneurPulse.com.au  Fact Sheet for Healthcare Providers:  IncredibleEmployment.be  This test is no t yet approved or cleared by the [place] FDA and  has been authorized for detection and/or diagnosis of SARS-CoV-2 by FDA under an Emergency Use Authorization (EUA). This EUA  will remain  in effect (meaning this test can be used) for the duration of the COVID-19 declaration under Section 564(b)(1) of the Act, 21 U.S.C.section 360bbb-3(b)(1), unless the authorization is terminated  or revoked sooner.       Influenza A by PCR NEGATIVE NEGATIVE Final   Influenza B by PCR NEGATIVE NEGATIVE Final    Comment: (NOTE) The Xpert Xpress SARS-CoV-2/FLU/RSV plus assay is intended as an aid in the diagnosis of influenza from Nasopharyngeal swab specimens and should not be used as a sole basis for treatment. Nasal washings and aspirates are unacceptable for Xpert Xpress SARS-CoV-2/FLU/RSV testing.  Fact Sheet for Patients: EntrepreneurPulse.com.au  Fact Sheet for Healthcare Providers: IncredibleEmployment.be  This test is not yet approved or cleared by the [place] FDA and has been authorized for detection and/or diagnosis of SARS-CoV-2 by FDA under an Emergency Use Authorization (EUA). This EUA will remain in effect (meaning this test can be used) for the duration of the COVID-19 declaration under Section 564(b)(1) of the Act, 21 U.S.C. section 360bbb-3(b)(1), unless the authorization is terminated or revoked.  Performed at [facility], [place] [address] [place], [place] [address]   Surgical PCR screen     Status: None   Collection Time: 11/17/21  8:35 PM   Specimen: Urine, Unspecified Source; Nasal Swab  Result Value Ref Range Status   MRSA, PCR NEGATIVE NEGATIVE Final   Staphylococcus aureus NEGATIVE NEGATIVE Final    Comment: (NOTE) The Xpert SA Assay (FDA approved for NASAL specimens in patients 75 years of age and older), is one component of a comprehensive surveillance program. It is not intended to diagnose infection nor to guide or monitor treatment. Performed at [facility], [place] [address], [place], [place] [address]          Radiology Studies: DG Wrist  Complete Right  Result Date: 11/17/2021 CLINICAL DATA:  Status post reduction of right wrist. EXAM: RIGHT WRIST - COMPLETE 3+ VIEW COMPARISON:  Right wrist radiographs earlier today FINDINGS: Assessment is limited by overlying splint material. Position and alignment of previously described distal radius and ulnar fractures is improved. There is mild residual lateral displacement of the distal ulna fracture. There is moderate residual lateral displacement, dorsal displacement, and impaction of the distal radius fracture. Soft tissue swelling is noted about the wrist. IMPRESSION: Improved alignment of distal radius and ulnar fractures with residual displacement as above. Electronically Signed   By: Logan Bores M.D.   On: 11/17/2021 16:33   CT Head Wo Contrast  Result Date: 11/17/2021 CLINICAL DATA:  Unwitnessed fall. EXAM: CT HEAD WITHOUT  CONTRAST CT CERVICAL SPINE WITHOUT CONTRAST TECHNIQUE: Multidetector CT imaging of the head and cervical spine was performed following the standard protocol without intravenous contrast. Multiplanar CT image reconstructions of the cervical spine were also generated. RADIATION DOSE REDUCTION: This exam was performed according to the departmental dose-optimization program which includes automated exposure control, adjustment of the mA and/or kV according to patient size and/or use of iterative reconstruction technique. COMPARISON:  September 04, 2019. FINDINGS: CT HEAD FINDINGS Brain: No evidence of acute infarction, hemorrhage, hydrocephalus, extra-axial collection or mass lesion/mass effect. Vascular: No hyperdense vessel or unexpected calcification. Skull: Normal. Negative for fracture or focal lesion. Sinuses/Orbits: No acute finding. Other: None. CT CERVICAL SPINE FINDINGS Alignment: Minimal grade 1 anterolisthesis of C3-4 and C4-5 is noted secondary to posterior facet joint hypertrophy. Skull base and vertebrae: No acute fracture. No primary bone lesion or focal pathologic  process. Soft tissues and spinal canal: No prevertebral fluid or swelling. No visible canal hematoma. Disc levels: Moderate degenerative disc disease is noted at C5-6 and C6-7. Upper chest: Negative. Other: Degenerative changes seen involving the bilateral facet joints. IMPRESSION: No acute intracranial abnormality seen. Multilevel degenerative changes are noted in the cervical spine. No acute abnormality seen. Electronically Signed   By: Lupita Raider M.D.   On: 11/17/2021 14:19   CT Cervical Spine Wo Contrast  Result Date: 11/17/2021 CLINICAL DATA:  Unwitnessed fall. EXAM: CT HEAD WITHOUT CONTRAST CT CERVICAL SPINE WITHOUT CONTRAST TECHNIQUE: Multidetector CT imaging of the head and cervical spine was performed following the standard protocol without intravenous contrast. Multiplanar CT image reconstructions of the cervical spine were also generated. RADIATION DOSE REDUCTION: This exam was performed according to the departmental dose-optimization program which includes automated exposure control, adjustment of the mA and/or kV according to patient size and/or use of iterative reconstruction technique. COMPARISON:  September 04, 2019. FINDINGS: CT HEAD FINDINGS Brain: No evidence of acute infarction, hemorrhage, hydrocephalus, extra-axial collection or mass lesion/mass effect. Vascular: No hyperdense vessel or unexpected calcification. Skull: Normal. Negative for fracture or focal lesion. Sinuses/Orbits: No acute finding. Other: None. CT CERVICAL SPINE FINDINGS Alignment: Minimal grade 1 anterolisthesis of C3-4 and C4-5 is noted secondary to posterior facet joint hypertrophy. Skull base and vertebrae: No acute fracture. No primary bone lesion or focal pathologic process. Soft tissues and spinal canal: No prevertebral fluid or swelling. No visible canal hematoma. Disc levels: Moderate degenerative disc disease is noted at C5-6 and C6-7. Upper chest: Negative. Other: Degenerative changes seen involving the  bilateral facet joints. IMPRESSION: No acute intracranial abnormality seen. Multilevel degenerative changes are noted in the cervical spine. No acute abnormality seen. Electronically Signed   By: Lupita Raider M.D.   On: 11/17/2021 14:19   DG Hip Unilat W or Wo Pelvis 2-3 Views Right  Result Date: 11/17/2021 CLINICAL DATA:  Unwitnessed fall. EXAM: DG HIP (WITH OR WITHOUT PELVIS) 2-3V RIGHT COMPARISON:  May 18, 2021. FINDINGS: Moderately displaced proximal right femoral neck fracture is noted. Status post left hip arthroplasty. IMPRESSION: Moderately displaced proximal right femoral neck fracture. Electronically Signed   By: Lupita Raider M.D.   On: 11/17/2021 13:47   DG Wrist Complete Right  Result Date: 11/17/2021 CLINICAL DATA:  Unwitnessed fall. EXAM: RIGHT WRIST - COMPLETE 3+ VIEW COMPARISON:  None Available. FINDINGS: Probable comminuted distal right radial and ulnar fractures are noted with severe dorsal displacement of distal fracture fragments. IMPRESSION: Severely displaced and comminuted distal right radial and ulnar fractures. Electronically Signed   By:  Lupita Raider M.D.   On: 11/17/2021 13:46   DG Chest Portable 1 View  Result Date: 11/17/2021 CLINICAL DATA:  Pt BIB [facility] form [facility] for an unwitnessed fall. Pt has deformity of the right wrist, possible injury to right hip. Splint to wrist, pelvic binding, HTNfall, hip fx, ams EXAM: PORTABLE CHEST 1 VIEW COMPARISON:  05/18/2021 FINDINGS: Normal cardiac silhouette ectatic aorta. LEFT lobe density new from comparison exam. Low lung volumes. No pneumothorax. No pulmonary edema. No acute osseous abnormality. IMPRESSION: LEFT lobe atelectasis versus infiltrate. Electronically Signed   By: Genevive Bi M.D.   On: 11/17/2021 13:44        Scheduled Meds:  [START ON 11/19/2021] acetaminophen  1,000 mg Oral 30 min Pre-Op   guaiFENesin  600 mg Oral BID   hydrALAZINE  25 mg Oral Q8H   lidocaine (PF)  30 mL Intradermal Once    povidone-iodine  2 Application Topical Once   Continuous Infusions:  sodium chloride 75 mL/hr at 11/18/21 1322   azithromycin 500 mg (11/18/21 1419)    ceFAZolin (ANCEF) IV     cefTRIAXone (ROCEPHIN)  IV 1 g (11/18/21 1326)     LOS: 1 day    Time spent: 35 minutes    Ramiro Harvest, MD [facility]   To contact the attending provider between 7A-7P or the covering provider during after hours 7P-7A, please log into the web site [email or web address] and access using universal [place] password for that web site. If you do not have the password, please call the hospital operator.  11/18/2021, 3:33 PM

## 2021-11-18 NOTE — Progress Notes (Signed)
Discussed plan of care with family.  We will plan for surgery tomorrow with Dr. Lyla Glassing, okay to eat right now, n.p.o. after midnight.  I have updated the family.  Marchia Bond, MD

## 2021-11-19 ENCOUNTER — Inpatient Hospital Stay (HOSPITAL_COMMUNITY): Payer: Medicare Other

## 2021-11-19 ENCOUNTER — Other Ambulatory Visit: Payer: Self-pay

## 2021-11-19 ENCOUNTER — Inpatient Hospital Stay (HOSPITAL_COMMUNITY): Payer: Medicare Other | Admitting: Anesthesiology

## 2021-11-19 ENCOUNTER — Encounter (HOSPITAL_COMMUNITY): Payer: Self-pay | Admitting: Internal Medicine

## 2021-11-19 ENCOUNTER — Encounter (HOSPITAL_COMMUNITY)
Admission: EM | Disposition: A | Discharge status: Skilled Nursing Facility | Payer: Self-pay | Source: Skilled Nursing Facility | Attending: Internal Medicine

## 2021-11-19 DIAGNOSIS — J189 Pneumonia, unspecified organism: Secondary | ICD-10-CM | POA: Diagnosis not present

## 2021-11-19 DIAGNOSIS — S62101A Fracture of unspecified carpal bone, right wrist, initial encounter for closed fracture: Secondary | ICD-10-CM

## 2021-11-19 DIAGNOSIS — S72001A Fracture of unspecified part of neck of right femur, initial encounter for closed fracture: Secondary | ICD-10-CM

## 2021-11-19 DIAGNOSIS — I10 Essential (primary) hypertension: Secondary | ICD-10-CM

## 2021-11-19 DIAGNOSIS — S52501A Unspecified fracture of the lower end of right radius, initial encounter for closed fracture: Secondary | ICD-10-CM

## 2021-11-19 HISTORY — PX: HIP ARTHROPLASTY: SHX981

## 2021-11-19 LAB — CBC
HCT: 30.4 % — ABNORMAL LOW (ref 36.0–46.0)
Hemoglobin: 9.6 g/dL — ABNORMAL LOW (ref 12.0–15.0)
MCH: 28.2 pg (ref 26.0–34.0)
MCHC: 31.6 g/dL (ref 30.0–36.0)
MCV: 89.4 fL (ref 80.0–100.0)
Platelets: 166 10*3/uL (ref 150–400)
RBC: 3.4 MIL/uL — ABNORMAL LOW (ref 3.87–5.11)
RDW: 17.3 % — ABNORMAL HIGH (ref 11.5–15.5)
WBC: 14.4 10*3/uL — ABNORMAL HIGH (ref 4.0–10.5)
nRBC: 0 % (ref 0.0–0.2)

## 2021-11-19 LAB — CBC WITH DIFFERENTIAL/PLATELET
Abs Immature Granulocytes: 0.07 10*3/uL (ref 0.00–0.07)
Basophils Absolute: 0 10*3/uL (ref 0.0–0.1)
Basophils Relative: 0 %
Eosinophils Absolute: 0.2 10*3/uL (ref 0.0–0.5)
Eosinophils Relative: 2 %
HCT: 32.1 % — ABNORMAL LOW (ref 36.0–46.0)
Hemoglobin: 10.3 g/dL — ABNORMAL LOW (ref 12.0–15.0)
Immature Granulocytes: 1 %
Lymphocytes Relative: 13 %
Lymphs Abs: 1.7 10*3/uL (ref 0.7–4.0)
MCH: 28.1 pg (ref 26.0–34.0)
MCHC: 32.1 g/dL (ref 30.0–36.0)
MCV: 87.7 fL (ref 80.0–100.0)
Monocytes Absolute: 0.9 10*3/uL (ref 0.1–1.0)
Monocytes Relative: 7 %
Neutro Abs: 10.7 10*3/uL — ABNORMAL HIGH (ref 1.7–7.7)
Neutrophils Relative %: 77 %
Platelets: 187 10*3/uL (ref 150–400)
RBC: 3.66 MIL/uL — ABNORMAL LOW (ref 3.87–5.11)
RDW: 16.9 % — ABNORMAL HIGH (ref 11.5–15.5)
WBC: 13.6 10*3/uL — ABNORMAL HIGH (ref 4.0–10.5)
nRBC: 0 % (ref 0.0–0.2)

## 2021-11-19 LAB — BASIC METABOLIC PANEL
Anion gap: 8 (ref 5–15)
BUN: 30 mg/dL — ABNORMAL HIGH (ref 8–23)
CO2: 24 mmol/L (ref 22–32)
Calcium: 8.9 mg/dL (ref 8.9–10.3)
Chloride: 107 mmol/L (ref 98–111)
Creatinine, Ser: 0.76 mg/dL (ref 0.44–1.00)
GFR, Estimated: >60 mL/min (ref >60)
Glucose, Bld: 93 mg/dL (ref 70–99)
Potassium: 4.2 mmol/L (ref 3.5–5.1)
Sodium: 139 mmol/L (ref 135–145)

## 2021-11-19 LAB — URINE CULTURE

## 2021-11-19 LAB — CREATININE, SERUM
Creatinine, Ser: 0.75 mg/dL (ref 0.44–1.00)
GFR, Estimated: >60 mL/min (ref >60)

## 2021-11-19 LAB — MAGNESIUM: Magnesium: 1.9 mg/dL (ref 1.7–2.4)

## 2021-11-19 SURGERY — HEMIARTHROPLASTY, HIP, DIRECT ANTERIOR APPROACH, FOR FRACTURE
Anesthesia: General | Site: Hip | Laterality: Right

## 2021-11-19 MED ORDER — ACETAMINOPHEN 10 MG/ML IV SOLN
INTRAVENOUS | Status: AC
  Filled 2021-11-19: qty 100

## 2021-11-19 MED ORDER — SUCCINYLCHOLINE CHLORIDE 200 MG/10ML IV SOSY
PREFILLED_SYRINGE | INTRAVENOUS | Status: DC | PRN
  Administered 2021-11-19: 140 mg via INTRAVENOUS

## 2021-11-19 MED ORDER — CEFAZOLIN SODIUM-DEXTROSE 2-4 GM/100ML-% IV SOLN
INTRAVENOUS | Status: AC
  Filled 2021-11-19: qty 100

## 2021-11-19 MED ORDER — KETOROLAC TROMETHAMINE 30 MG/ML IJ SOLN
INTRAMUSCULAR | Status: AC
  Filled 2021-11-19: qty 1

## 2021-11-19 MED ORDER — ONDANSETRON HCL 4 MG/2ML IJ SOLN
INTRAMUSCULAR | Status: DC | PRN
  Administered 2021-11-19: 4 mg via INTRAVENOUS

## 2021-11-19 MED ORDER — TRANEXAMIC ACID-NACL 1000-0.7 MG/100ML-% IV SOLN
INTRAVENOUS | Status: AC
  Filled 2021-11-19: qty 100

## 2021-11-19 MED ORDER — ACETAMINOPHEN 10 MG/ML IV SOLN
INTRAVENOUS | Status: DC | PRN
  Administered 2021-11-19: 1000 mg via INTRAVENOUS

## 2021-11-19 MED ORDER — OXYCODONE HCL 5 MG PO TABS: 5.0000 mg | ORAL_TABLET | Freq: Once | ORAL | Status: DC | PRN

## 2021-11-19 MED ORDER — SODIUM CHLORIDE 0.9 % IR SOLN
Status: DC | PRN
  Administered 2021-11-19 (×2): 1000 mL

## 2021-11-19 MED ORDER — ORAL CARE MOUTH RINSE: 15.0000 mL | OROMUCOSAL | Status: DC | PRN

## 2021-11-19 MED ORDER — ONDANSETRON HCL 4 MG PO TABS: 4.0000 mg | ORAL_TABLET | Freq: Four times a day (QID) | ORAL | Status: DC | PRN

## 2021-11-19 MED ORDER — PHENYLEPHRINE HCL-NACL 20-0.9 MG/250ML-% IV SOLN
INTRAVENOUS | Status: DC | PRN
  Administered 2021-11-19: 20 ug/min via INTRAVENOUS

## 2021-11-19 MED ORDER — METOCLOPRAMIDE HCL 5 MG PO TABS: 5.0000 mg | ORAL_TABLET | Freq: Three times a day (TID) | ORAL | Status: DC | PRN

## 2021-11-19 MED ORDER — DEXAMETHASONE SODIUM PHOSPHATE 10 MG/ML IJ SOLN
INTRAMUSCULAR | Status: AC
  Filled 2021-11-19: qty 1

## 2021-11-19 MED ORDER — EPHEDRINE 5 MG/ML INJ
INTRAVENOUS | Status: AC
  Filled 2021-11-19: qty 5

## 2021-11-19 MED ORDER — KETOROLAC TROMETHAMINE 30 MG/ML IJ SOLN
INTRAMUSCULAR | Status: DC | PRN
  Administered 2021-11-19: 30 mg via INTRAMUSCULAR

## 2021-11-19 MED ORDER — ETOMIDATE 2 MG/ML IV SOLN
INTRAVENOUS | Status: DC | PRN
  Administered 2021-11-19: 12 mg via INTRAVENOUS

## 2021-11-19 MED ORDER — SENNA 8.6 MG PO TABS
1.0000 | ORAL_TABLET | Freq: Two times a day (BID) | ORAL | Status: DC
  Administered 2021-11-19 – 2021-11-25 (×7): 8.6 mg via ORAL
  Filled 2021-11-19 (×9): qty 1

## 2021-11-19 MED ORDER — LIDOCAINE 2% (20 MG/ML) 5 ML SYRINGE
INTRAMUSCULAR | Status: DC | PRN
  Administered 2021-11-19: 60 mg via INTRAVENOUS

## 2021-11-19 MED ORDER — POLYETHYLENE GLYCOL 3350 17 G PO PACK: 17.0000 g | PACK | Freq: Every day | ORAL | Status: DC | PRN

## 2021-11-19 MED ORDER — SUCCINYLCHOLINE CHLORIDE 200 MG/10ML IV SOSY
PREFILLED_SYRINGE | INTRAVENOUS | Status: AC
  Filled 2021-11-19: qty 10

## 2021-11-19 MED ORDER — CEFAZOLIN SODIUM-DEXTROSE 2-4 GM/100ML-% IV SOLN
2.0000 g | Freq: Four times a day (QID) | INTRAVENOUS | Status: AC
  Administered 2021-11-19 – 2021-11-20 (×2): 2 g via INTRAVENOUS
  Filled 2021-11-19 (×2): qty 100

## 2021-11-19 MED ORDER — BUPIVACAINE-EPINEPHRINE (PF) 0.5% -1:200000 IJ SOLN
INTRAMUSCULAR | Status: DC | PRN
  Administered 2021-11-19: 30 mL

## 2021-11-19 MED ORDER — BUPIVACAINE-EPINEPHRINE (PF) 0.5% -1:200000 IJ SOLN
INTRAMUSCULAR | Status: AC
  Filled 2021-11-19: qty 30

## 2021-11-19 MED ORDER — FENTANYL CITRATE (PF) 100 MCG/2ML IJ SOLN
INTRAMUSCULAR | Status: AC
  Filled 2021-11-19: qty 2

## 2021-11-19 MED ORDER — OXYCODONE HCL 5 MG/5ML PO SOLN: 5.0000 mg | Freq: Once | ORAL | Status: DC | PRN

## 2021-11-19 MED ORDER — DEXAMETHASONE SODIUM PHOSPHATE 10 MG/ML IJ SOLN
INTRAMUSCULAR | Status: DC | PRN
  Administered 2021-11-19: 10 mg via INTRAVENOUS

## 2021-11-19 MED ORDER — MENTHOL 3 MG MT LOZG: 1.0000 | LOZENGE | OROMUCOSAL | Status: DC | PRN

## 2021-11-19 MED ORDER — FENTANYL CITRATE PF 50 MCG/ML IJ SOSY: 25.0000 ug | PREFILLED_SYRINGE | INTRAMUSCULAR | Status: DC | PRN

## 2021-11-19 MED ORDER — EPHEDRINE SULFATE-NACL 50-0.9 MG/10ML-% IV SOSY
PREFILLED_SYRINGE | INTRAVENOUS | Status: DC | PRN
  Administered 2021-11-19: 10 mg via INTRAVENOUS

## 2021-11-19 MED ORDER — SODIUM CHLORIDE (PF) 0.9 % IJ SOLN
INTRAMUSCULAR | Status: DC | PRN
  Administered 2021-11-19: 30 mL

## 2021-11-19 MED ORDER — PHENOL 1.4 % MT LIQD: 1.0000 | OROMUCOSAL | Status: DC | PRN

## 2021-11-19 MED ORDER — PHENYLEPHRINE 80 MCG/ML (10ML) SYRINGE FOR IV PUSH (FOR BLOOD PRESSURE SUPPORT)
PREFILLED_SYRINGE | INTRAVENOUS | Status: DC | PRN
  Administered 2021-11-19: 160 ug via INTRAVENOUS

## 2021-11-19 MED ORDER — SODIUM CHLORIDE (PF) 0.9 % IJ SOLN
INTRAMUSCULAR | Status: AC
  Filled 2021-11-19: qty 30

## 2021-11-19 MED ORDER — PHENYLEPHRINE HCL-NACL 20-0.9 MG/250ML-% IV SOLN
INTRAVENOUS | Status: AC
  Filled 2021-11-19: qty 250

## 2021-11-19 MED ORDER — ONDANSETRON HCL 4 MG/2ML IJ SOLN: 4.0000 mg | Freq: Four times a day (QID) | INTRAMUSCULAR | Status: DC | PRN

## 2021-11-19 MED ORDER — CHLORHEXIDINE GLUCONATE 0.12 % MT SOLN
15.0000 mL | Freq: Once | OROMUCOSAL | Status: AC
  Administered 2021-11-19: 15 mL via OROMUCOSAL

## 2021-11-19 MED ORDER — LACTATED RINGERS IV SOLN: INTRAVENOUS | Status: DC

## 2021-11-19 MED ORDER — ONDANSETRON HCL 4 MG/2ML IJ SOLN
INTRAMUSCULAR | Status: AC
  Filled 2021-11-19: qty 2

## 2021-11-19 MED ORDER — 0.9 % SODIUM CHLORIDE (POUR BTL) OPTIME
TOPICAL | Status: DC | PRN
  Administered 2021-11-19: 1000 mL

## 2021-11-19 MED ORDER — ENOXAPARIN SODIUM 300 MG/3ML IJ SOLN
20.0000 mg | INTRAMUSCULAR | Status: DC
  Filled 2021-11-19: qty 0.2

## 2021-11-19 MED ORDER — CEFAZOLIN SODIUM-DEXTROSE 2-3 GM-%(50ML) IV SOLR
INTRAVENOUS | Status: DC | PRN
  Administered 2021-11-19: 2 g via INTRAVENOUS

## 2021-11-19 MED ORDER — DOCUSATE SODIUM 100 MG PO CAPS
100.0000 mg | ORAL_CAPSULE | Freq: Two times a day (BID) | ORAL | Status: DC
  Administered 2021-11-19 – 2021-11-25 (×5): 100 mg via ORAL
  Filled 2021-11-19 (×8): qty 1

## 2021-11-19 MED ORDER — ETOMIDATE 2 MG/ML IV SOLN
INTRAVENOUS | Status: AC
  Filled 2021-11-19: qty 10

## 2021-11-19 MED ORDER — METOCLOPRAMIDE HCL 5 MG/ML IJ SOLN: 5.0000 mg | Freq: Three times a day (TID) | INTRAMUSCULAR | Status: DC | PRN

## 2021-11-19 MED ORDER — ONDANSETRON HCL 4 MG/2ML IJ SOLN: 4.0000 mg | Freq: Once | INTRAMUSCULAR | Status: DC | PRN

## 2021-11-19 MED ORDER — FENTANYL CITRATE (PF) 100 MCG/2ML IJ SOLN
INTRAMUSCULAR | Status: DC | PRN
  Administered 2021-11-19 (×2): 25 ug via INTRAVENOUS

## 2021-11-19 MED ORDER — TRANEXAMIC ACID-NACL 1000-0.7 MG/100ML-% IV SOLN
1000.0000 mg | INTRAVENOUS | Status: AC
  Administered 2021-11-19: 1000 mg via INTRAVENOUS

## 2021-11-19 SURGICAL SUPPLY — 44 items
BAG COUNTER SPONGE SURGICOUNT (BAG) IMPLANT
BIT DRILL 2.0X128 (BIT) ×1 IMPLANT
BLADE SAW SGTL 73X25 THK (BLADE) ×1 IMPLANT
BNDG ELASTIC 4X5.8 VLCR STR LF (GAUZE/BANDAGES/DRESSINGS) IMPLANT
BNDG PLASTER X FAST 2X3 WHT LF (CAST SUPPLIES) IMPLANT
BNDG PLASTER X FAST 4X5 WHT LF (CAST SUPPLIES) IMPLANT
BNDG PLASTER X FAST 6X5 WHT LF (CAST SUPPLIES) IMPLANT
COVER SURGICAL LIGHT HANDLE (MISCELLANEOUS) ×1 IMPLANT
DERMABOND ADVANCED .7 DNX12 (GAUZE/BANDAGES/DRESSINGS) IMPLANT
DRAPE ORTHO SPLIT 77X108 STRL (DRAPES) ×2
DRAPE SURG ORHT 6 SPLT 77X108 (DRAPES) ×2 IMPLANT
DRAPE U-SHAPE 47X51 STRL (DRAPES) ×1 IMPLANT
DRSG AQUACEL AG ADV 3.5X10 (GAUZE/BANDAGES/DRESSINGS) IMPLANT
ELECT REM PT RETURN 15FT ADLT (MISCELLANEOUS) ×1 IMPLANT
FACESHIELD WRAPAROUND (MASK) ×1 IMPLANT
FACESHIELD WRAPAROUND OR TEAM (MASK) ×1 IMPLANT
GLOVE BIO SURGEON STRL SZ7 (GLOVE) ×1 IMPLANT
GLOVE BIOGEL PI IND STRL 7.5 (GLOVE) IMPLANT
GLOVE BIOGEL PI IND STRL 8.5 (GLOVE) IMPLANT
GOWN STRL REUS W/ TWL LRG LVL3 (GOWN DISPOSABLE) ×2 IMPLANT
GOWN STRL REUS W/TWL LRG LVL3 (GOWN DISPOSABLE) ×2
HEAD MOD COCR 28MM HD -6MM NK (Orthopedic Implant) IMPLANT
HOOD PEEL AWAY T7 (MISCELLANEOUS) IMPLANT
KIT BASIN OR (CUSTOM PROCEDURE TRAY) ×1 IMPLANT
KIT TURNOVER KIT A (KITS) IMPLANT
NS IRRIG 1000ML POUR BTL (IV SOLUTION) ×1 IMPLANT
PACK TOTAL JOINT (CUSTOM PROCEDURE TRAY) ×1 IMPLANT
PADDING CAST SYNTHETIC 3X4 NS (CAST SUPPLIES) IMPLANT
PROTECTOR NERVE ULNAR (MISCELLANEOUS) ×1 IMPLANT
RINGBLOC BI POLAR 28X47MM (Orthopedic Implant) ×1 IMPLANT
SHELL RINGBLOC BI POLR 28X47MM (Orthopedic Implant) IMPLANT
SLING ARM FOAM STRAP LRG (SOFTGOODS) IMPLANT
SOLUTION PRONTOSAN WOUND 350ML (IRRIGATION / IRRIGATOR) IMPLANT
STEM FEM REVISION 20MMX175MM (Stem) IMPLANT
SUT FIBERWIRE #2 38 REV NDL BL (SUTURE) ×3
SUT VIC AB 0 CT1 36 (SUTURE) ×1 IMPLANT
SUT VIC AB 1 CT1 27 (SUTURE)
SUT VIC AB 1 CT1 27XBRD ANTBC (SUTURE) IMPLANT
SUT VIC AB 2-0 CT1 27 (SUTURE) ×2
SUT VIC AB 2-0 CT1 TAPERPNT 27 (SUTURE) ×2 IMPLANT
SUT VIC AB 3-0 SH 8-18 (SUTURE) ×1 IMPLANT
SUTURE FIBERWR#2 38 REV NDL BL (SUTURE) ×3 IMPLANT
TRAY FOLEY MTR SLVR 16FR STAT (SET/KITS/TRAYS/PACK) IMPLANT
WATER STERILE IRR 1000ML POUR (IV SOLUTION) ×1 IMPLANT

## 2021-11-19 NOTE — Progress Notes (Signed)
  Progress Note   Patient: Caitlyn Braun [id number]:[id number] DOB: 08/05/1922 DOA: 11/17/2021     2 DOS: the patient was seen and examined on 11/19/2021   Brief hospital course: 88 year old female history of hypertension, hearing loss, noted to apparently be home with hospice presenting with altered mental status and unwitnessed falls.  Patient unable to provide history per admitting physician and information taken from the chart which is noted that patient did have some hallucinations and confusions 3 days prior to admission.  Patient noted to have tried to leave her bed had an unwitnessed fall with complaints of right hip pain and right wrist pain EMS alerted brought to the ED.  Imaging done consistent with proximal right femoral neck fracture, severely displaced and commuted distal right radial and ulnar fractures.  Chest x-ray concerning for atelectasis versus infiltrate.  Patient placed empirically on IV antibiotics, admitted, orthopedics consulted who spoke with patient's son over the phone and plan was right hip hemiarthroplasty, repeat right distal radius and ulna closed reduction while under anesthesia for palliative purposes for pain control and to allow mobility.  Assessment and Plan: #1 proximal right femoral neck fracture/severely displaced and commuted distal right radial and ulnar fractures -Secondary to mechanical fall. -Orthopedic Surgery was consulted. Pt now s/p surgery 11/19/21 -f/u on PT/OT recs -Cont analgesia as needed   2.  Left lower lobe pneumonia -Chest x-ray done concerning for atelectasis versus infiltrate. -Noted with a leukocytosis. -Pt was continued on empiric IV Rocephin, IV azithromycin for coverage for pneumonia. -Continue Mucinex.   3.  Hypertension -Not on medications per med rec. -Continue current regimen of hydralazine 25 mg 3 times daily. -BP stable at this time   4.  Acute metabolic encephalopathy -Likely secondary to left lower lobe pneumonia in the  setting of pain from right femoral neck fracture and distal right radial and ulnar fractures. -Continue empiric antibiotics. -Gentle hydration. -Supportive care.   5.  Normocytic anemia -No overt bleeding. -Hemodynamically stable      Subjective: Without complaints this AM   Physical Exam: Vitals:   11/19/21 1715 11/19/21 1730 11/19/21 1745 11/19/21 1812  BP: (!) 151/68 (!) 152/80 (!) 142/60 (!) 134/58  Pulse: 63 66 65 66  Resp: 16 15 13 17   Temp:    97.7 F (36.5 C)  TempSrc:    Oral  SpO2: 99% 93% 99% 100%  Weight:      Height:       General exam: Awake, laying in bed, in nad Respiratory system: Normal respiratory effort, no wheezing Cardiovascular system: regular rate, s1, s2 Gastrointestinal system: Soft, nondistended, positive BS Central nervous system: CN2-12 grossly intact, strength intact Extremities: Perfused, no clubbing Skin: Normal skin turgor, no notable skin lesions seen Psychiatry: Mood normal // no visual hallucinations   Data Reviewed:  Labs reviewed: Na 139, K 4.2, Cr 0.76, Hgb 10.3  Family Communication: Pt in room, family not at bedside  Disposition: Status is: Inpatient Remains inpatient appropriate because: Severity of illness  Planned Discharge Destination: Skilled nursing facility    Author: Marylu Lund, MD 11/19/2021 6:33 PM  For on call review [email or web address].

## 2021-11-19 NOTE — Interval H&P Note (Signed)
History and Physical Interval Note:  60/0/4599 7:74 PM  [name]  has presented today for surgery, with the diagnosis of right hip fracture.  The various methods of treatment have been discussed with the patient and family. After consideration of risks, benefits and other options for treatment, the patient has consented to  Procedure(s): ARTHROPLASTY BIPOLAR HIP (HEMIARTHROPLASTY) (Right) as a surgical intervention.  The patient's history has been reviewed, patient examined, no change in status, stable for surgery.  I have reviewed the patient's chart and labs.  Questions were answered to the patient's satisfaction.    Discussed treatments options with son, who wishes to proceed   Hilton Cork Aulton Routt

## 2021-11-19 NOTE — Plan of Care (Signed)
?  Problem: Clinical Measurements: ?Goal: Will remain free from infection ?Outcome: Progressing ?  ?

## 2021-11-19 NOTE — Anesthesia Procedure Notes (Signed)
Procedure Name: Intubation Date/Time: 11/19/2021 2:26 PM  Performed by: Lind Covert, CRNAPre-anesthesia Checklist: Patient identified, Emergency Drugs available, Suction available, Patient being monitored and Timeout performed Patient Re-evaluated:Patient Re-evaluated prior to induction Oxygen Delivery Method: Circle system utilized Preoxygenation: Pre-oxygenation with 100% oxygen Induction Type: IV induction, Rapid sequence and Cricoid Pressure applied Laryngoscope Size: Mac and 3 Grade View: Grade I Tube type: Oral Tube size: 7.0 mm Number of attempts: 1 Airway Equipment and Method: Stylet Placement Confirmation: ETT inserted through vocal cords under direct vision, positive ETCO2 and breath sounds checked- equal and bilateral Secured at: 21 cm Tube secured with: Tape Dental Injury: Teeth and Oropharynx as per pre-operative assessment

## 2021-11-19 NOTE — Hospital Course (Signed)
88 year old female history of hypertension, hearing loss, noted to apparently be home with hospice presenting with altered mental status and unwitnessed falls.  Patient unable to provide history per admitting physician and information taken from the chart which is noted that patient did have some hallucinations and confusions 3 days prior to admission.  Patient noted to have tried to leave her bed had an unwitnessed fall with complaints of right hip pain and right wrist pain EMS alerted brought to the ED.  Imaging done consistent with proximal right femoral neck fracture, severely displaced and commuted distal right radial and ulnar fractures.  Chest x-ray concerning for atelectasis versus infiltrate.  Patient placed empirically on IV antibiotics, admitted, orthopedics consulted who spoke with patient's son over the phone and plan was right hip hemiarthroplasty, repeat right distal radius and ulna closed reduction while under anesthesia for palliative purposes for pain control and to allow mobility.

## 2021-11-19 NOTE — Consult Note (Signed)
  ORTHOPAEDIC CONSULTATION  REQUESTING PHYSICIAN: Chiu, Stephen K, MD  PCP:  Patient, No Pcp Per  Chief Complaint: right hip injury, right wrist injury from fall.   HPI: Caitlyn Braun is a 88 y.o. female with PMH of hypertension, hearing loss, noted to apparently be home with hospice presenting with altered mental status and unwitnessed falls.  Patient unable to provide history per admitting physician and information taken from the chart. Patient noted to have tried to leave her bed had an unwitnessed fall with complaints of right hip pain and right wrist pain EMS alerted brought to the ED.  X-rays showed right femoral neck fracture, severely displaced and commuted distal right radial and ulnar fractures. Patient placed empirically on IV antibiotics, admitted, orthopedics consulted.   Blanie Brown PA-C spoke with patient's son over the phone and plan was right hip hemiarthroplasty, repeat right distal radius and ulna closed reduction while under anesthesia for palliative purposes for pain control and to allow mobility. At baseline she does not lay in a bed but she does need significant help for mobility. She need 2 person assistance with or lift to transfer bed to wheelchair. She uses a wheelchair at baseline and is unable to navigate this on her own. Her right hand has previously been her strongest hand.      Past Medical History:  Diagnosis Date   Hearing loss    Hypertension    Past Surgical History:  Procedure Laterality Date   JOINT REPLACEMENT     Social History   Socioeconomic History   Marital status: Widowed    Spouse name: Not on file   Number of children: Not on file   Years of education: Not on file   Highest education level: Not on file  Occupational History   Not on file  Tobacco Use   Smoking status: Never   Smokeless tobacco: Never  Substance and Sexual Activity   Alcohol use: Never   Drug use: Never   Sexual activity: Not on file  Other Topics Concern    Not on file  Social History Narrative   Not on file   Social Determinants of Health   Financial Resource Strain: Not on file  Food Insecurity: Not on file  Transportation Needs: Not on file  Physical Activity: Not on file  Stress: Not on file  Social Connections: Not on file   History reviewed. No pertinent family history. No Known Allergies Prior to Admission medications   Medication Sig Start Date End Date Taking? Authorizing Provider  acetaminophen (TYLENOL) 500 MG tablet Take 1,000 mg by mouth every 12 (twelve) hours as needed for headache.   Yes [provider]  loperamide (IMODIUM) 2 MG capsule Take 1 capsule (2 mg total) by mouth as needed for diarrhea or loose stools. 05/23/21  Yes Akula, Vijaya, MD   DG Knee Right Port  Result Date: 11/19/2021 CLINICAL DATA:  Right femoral neck fracture EXAM: PORTABLE RIGHT KNEE - 1-2 VIEW COMPARISON:  None Available. FINDINGS: Negative for fracture. There is mild lateral subluxation of the tibial plateau with respect to the femoral condyles presumably degenerative. There is moderate narrowing of the articular cartilage in the medial compartment of the knee, and marked narrowing in the lateral compartment with near bone on bone apposition. Marginal spurring about both compartments. Moderately large knee effusion. IMPRESSION: 1. Negative for fracture. 2. Advanced DJD, with large knee effusion. Electronically Signed   By: D  Hassell M.D.   On: 11/19/2021 08:25     DG Wrist Complete Right  Result Date: 11/17/2021 CLINICAL DATA:  Status post reduction of right wrist. EXAM: RIGHT WRIST - COMPLETE 3+ VIEW COMPARISON:  Right wrist radiographs earlier today FINDINGS: Assessment is limited by overlying splint material. Position and alignment of previously described distal radius and ulnar fractures is improved. There is mild residual lateral displacement of the distal ulna fracture. There is moderate residual lateral displacement, dorsal  displacement, and impaction of the distal radius fracture. Soft tissue swelling is noted about the wrist. IMPRESSION: Improved alignment of distal radius and ulnar fractures with residual displacement as above. Electronically Signed   By: Allen  Grady M.D.   On: 11/17/2021 16:33   CT Head Wo Contrast  Result Date: 11/17/2021 CLINICAL DATA:  Unwitnessed fall. EXAM: CT HEAD WITHOUT CONTRAST CT CERVICAL SPINE WITHOUT CONTRAST TECHNIQUE: Multidetector CT imaging of the head and cervical spine was performed following the standard protocol without intravenous contrast. Multiplanar CT image reconstructions of the cervical spine were also generated. RADIATION DOSE REDUCTION: This exam was performed according to the departmental dose-optimization program which includes automated exposure control, adjustment of the mA and/or kV according to patient size and/or use of iterative reconstruction technique. COMPARISON:  September 04, 2019. FINDINGS: CT HEAD FINDINGS Brain: No evidence of acute infarction, hemorrhage, hydrocephalus, extra-axial collection or mass lesion/mass effect. Vascular: No hyperdense vessel or unexpected calcification. Skull: Normal. Negative for fracture or focal lesion. Sinuses/Orbits: No acute finding. Other: None. CT CERVICAL SPINE FINDINGS Alignment: Minimal grade 1 anterolisthesis of C3-4 and C4-5 is noted secondary to posterior facet joint hypertrophy. Skull base and vertebrae: No acute fracture. No primary bone lesion or focal pathologic process. Soft tissues and spinal canal: No prevertebral fluid or swelling. No visible canal hematoma. Disc levels: Moderate degenerative disc disease is noted at C5-6 and C6-7. Upper chest: Negative. Other: Degenerative changes seen involving the bilateral facet joints. IMPRESSION: No acute intracranial abnormality seen. Multilevel degenerative changes are noted in the cervical spine. No acute abnormality seen. Electronically Signed   By: James  Green Jr M.D.   On:  11/17/2021 14:19   CT Cervical Spine Wo Contrast  Result Date: 11/17/2021 CLINICAL DATA:  Unwitnessed fall. EXAM: CT HEAD WITHOUT CONTRAST CT CERVICAL SPINE WITHOUT CONTRAST TECHNIQUE: Multidetector CT imaging of the head and cervical spine was performed following the standard protocol without intravenous contrast. Multiplanar CT image reconstructions of the cervical spine were also generated. RADIATION DOSE REDUCTION: This exam was performed according to the departmental dose-optimization program which includes automated exposure control, adjustment of the mA and/or kV according to patient size and/or use of iterative reconstruction technique. COMPARISON:  September 04, 2019. FINDINGS: CT HEAD FINDINGS Brain: No evidence of acute infarction, hemorrhage, hydrocephalus, extra-axial collection or mass lesion/mass effect. Vascular: No hyperdense vessel or unexpected calcification. Skull: Normal. Negative for fracture or focal lesion. Sinuses/Orbits: No acute finding. Other: None. CT CERVICAL SPINE FINDINGS Alignment: Minimal grade 1 anterolisthesis of C3-4 and C4-5 is noted secondary to posterior facet joint hypertrophy. Skull base and vertebrae: No acute fracture. No primary bone lesion or focal pathologic process. Soft tissues and spinal canal: No prevertebral fluid or swelling. No visible canal hematoma. Disc levels: Moderate degenerative disc disease is noted at C5-6 and C6-7. Upper chest: Negative. Other: Degenerative changes seen involving the bilateral facet joints. IMPRESSION: No acute intracranial abnormality seen. Multilevel degenerative changes are noted in the cervical spine. No acute abnormality seen. Electronically Signed   By: James  Green Jr M.D.   On: 11/17/2021 14:19     DG Hip Unilat W or Wo Pelvis 2-3 Views Right  Result Date: 11/17/2021 CLINICAL DATA:  Unwitnessed fall. EXAM: DG HIP (WITH OR WITHOUT PELVIS) 2-3V RIGHT COMPARISON:  May 18, 2021. FINDINGS: Moderately displaced proximal right  femoral neck fracture is noted. Status post left hip arthroplasty. IMPRESSION: Moderately displaced proximal right femoral neck fracture. Electronically Signed   By: Marijo Conception M.D.   On: 11/17/2021 13:47   DG Wrist Complete Right  Result Date: 11/17/2021 CLINICAL DATA:  Unwitnessed fall. EXAM: RIGHT WRIST - COMPLETE 3+ VIEW COMPARISON:  None Available. FINDINGS: Probable comminuted distal right radial and ulnar fractures are noted with severe dorsal displacement of distal fracture fragments. IMPRESSION: Severely displaced and comminuted distal right radial and ulnar fractures. Electronically Signed   By: Marijo Conception M.D.   On: 11/17/2021 13:46   DG Chest Portable 1 View  Result Date: 11/17/2021 CLINICAL DATA:  Pt BIB [facility] form [place] for an unwitnessed fall. Pt has deformity of the right wrist, possible injury to right hip. Splint to wrist, pelvic binding, HTNfall, hip fx, ams EXAM: PORTABLE CHEST 1 VIEW COMPARISON:  05/18/2021 FINDINGS: Normal cardiac silhouette ectatic aorta. LEFT lobe density new from comparison exam. Low lung volumes. No pneumothorax. No pulmonary edema. No acute osseous abnormality. IMPRESSION: LEFT lobe atelectasis versus infiltrate. Electronically Signed   By: Suzy Bouchard M.D.   On: 11/17/2021 13:44    Positive ROS: All other systems have been reviewed and were otherwise negative with the exception of those mentioned in the HPI and as above.  Physical Exam: General: No acute distress. Physical exam limited due to confusion and not wanting to stay awake for exam.  Cardiovascular: No pedal edema Respiratory: No cyanosis, no use of accessory musculature GI: No organomegaly, abdomen is soft and non-tender Skin: No lesions in the area of chief complaint Neurologic:  Psychiatric: Patient is sleeping tried to wake up for exam. She kept falling back asleep. She is oriented to her name and birth month and day only.  Lymphatic: No axillary or cervical  lymphadenopathy  MUSCULOSKELETAL:  Exam limited due to patient not following commands. Examination of the right hip reveals no skin wounds or lesions. Extremity externally rotated and slightly shortened. Pain with palpation over lateral hip and with motion. She did respond to firm pressure on her foot and noted plantar flexion and dorsiflexion intact in RLE. Unable to palpate right pedal pulses but were present with doppler. Capillary refill <2 seconds in LE bilaterally. No significant pedal edema. Compartment soft.   Examination of the right wrist. She is in a splint and propped up on a pillow with ice. Capillary refill <2 seconds. Fingers and hand swollen. Bruising over fingers. She would not move fingers on command. Unable to assess sensation due to mental status.    Assessment: Displaced right femoral neck fracture.  Distal radius and ulnar fracture.   Plan: She has an unstable right femoral neck fracture. Plan for right hip hemiarthroplasty, repeat right distal radius and ulna closed reduction today while under anesthesia for palliative purposes for pain control and to allow mobility. She has already been admitted to the hospitalist service for perioperative medical optimization. She has been NPO since midnight. Continue to hold chemical DVT ppx.     Charlott Rakes, PA-C    11/19/2021 12:05 PM

## 2021-11-19 NOTE — Op Note (Signed)
OPERATIVE REPORT  SURGEON: Samson Frederic, MD   ASSISTANT: Clint Bolder, PA-C  PREOPERATIVE DIAGNOSIS: Displaced Right femoral neck fracture.  Right distal radius fracture.  POSTOPERATIVE DIAGNOSIS: Displaced Right femoral neck fracture.  Right distal radius fracture. Interpretation of fluoroscopic images  PROCEDURE: Right hip hemiarthroplasty, anterior approach.  Closed reduction right distal radius, application of sugar tong splint.  IMPLANTS: Biomet Arcos one piece stem, size 20 x 175 mm, standard offset, with a 28 - 6 mm metal head ball and a 47 mm bipolar construct.  ANESTHESIA:  General  ANTIBIOTICS: 2g ancef.  ESTIMATED BLOOD LOSS:-400 mL    DRAINS: None.  COMPLICATIONS: None   CONDITION: PACU - hemodynamically stable.   BRIEF CLINICAL NOTE: Caitlyn Braun is a 88 y.o. female with a displaced Right femoral neck fracture and displaced right distal radius fracture. The patient was admitted to the hospitalist service and underwent perioperative risk stratification and medical optimization. The risks, benefits, and alternatives to hemiarthroplasty were explained, and the patient elected to proceed.  PROCEDURE IN DETAIL: The patient was taken to the operating room and general anesthesia was induced on the hospital bed.  The patient was then positioned on the Hana table.  All bony prominences were well padded.  The hip was prepped and draped in the normal sterile surgical fashion.  A time-out was called verifying side and site of surgery. Antibiotics were given within 60 minutes of beginning the procedure.   Bikini incision was made, and the direct anterior approach to the hip was performed through the Hueter interval.  Superficial dissection was carried out lateral to the ASIS. Lateral femoral circumflex vessels were treated with the Auqumantys. The anterior capsule was exposed and an inverted T capsulotomy was made. Fracture hematoma was encountered and evacuated. The patient was  found to have a comminuted Right subcapital femoral neck fracture.  Inferior pubofemoral ligament was released subperiosteally to the lesser trochanter. I freshened the femoral neck cut with a saw.  I removed the femoral neck fragment.  A corkscrew was placed into the head and the head was removed.  This was passed to the back table and was measured.   Acetabular exposure was achieved.  I examined the articular cartilage which was intact.  The labrum was intact. A 47 mm trial head was placed and found to have excellent fit.   I then gained femoral exposure taking care to protect the abductors and greater trochanter.  The superior capsule was incised longitudinally, staying lateral to the posterior border of the femoral neck. External rotation, extension, and adduction were applied.  A cookie cutter was used to enter the femoral canal, and then the femoral canal finder was used to confirm location.  I then sequentially reamed and broached up to a size 20.  Calcar planer was used on the femoral neck remnant.  I placed a standard offset neck and a trial bipolar construct. The hip was reduced.  Leg lengths were checked fluoroscopically.  The hip was dislocated and trial components were removed.  I placed the real stem followed by the real spacer and head ball.  A single reduction maneuver was performed and the hip was reduced.  Fluoroscopy was used to confirm component position and leg lengths.  At 90 degrees of external rotation and extension, the hip was stable to an anterior directed force.   The wound was copiously irrigated with Irrisept solution and normal saline using pulse lavage.  Marcaine solution was injected into the periarticular soft tissue.  The  wound was closed in layers using #1 Stratafix for the fascia, 2-0 Vicryl for the subcutaneous fat, 2-0 Monocryl for the deep dermal layer, and staples + Dermabond for the skin.  Once the glue was fully dried, an Aquacell Ag dressing was applied.     Attention was then turned to the right wrist. Her splint was removed. A small superficial skin tear was noted on the dorsum of the distal forearm. A xeroform dressing was placed. My assistant applied traction, and then a single reduction maneuver was performed. A well-padded, well-molded sugar tong plaster splint was applied. The reduction was checked on AP and lateral fluoroscopy views, and was noted to be improved. Capillary refill < 2 seconds.  The patient was then awakened from anesthesia and transported to the recovery room in stable condition.  Sponge, needle, and instrument counts were correct at the end of the case x2.  The patient tolerated the procedure well and there were no known complications.  Please note that a surgical assistant was a medical necessity for this procedure to perform it in a safe and expeditious manner. Assistant was necessary to provide appropriate retraction of vital neurovascular structures, to prevent femoral fracture, and to allow for anatomic placement of the prosthesis.

## 2021-11-19 NOTE — Anesthesia Preprocedure Evaluation (Addendum)
Anesthesia Evaluation  Patient identified by MRN, date of birth, ID band Patient awake  General Assessment Comment:Case discussed with son in detail. Dr. Nyoka Cowden  Reviewed: Allergy & Precautions, NPO status , Patient's Chart, lab work & pertinent test results  Airway Mallampati: II       Dental   Pulmonary pneumonia,    breath sounds clear to auscultation       Cardiovascular hypertension,  Rhythm:Regular Rate:Normal     Neuro/Psych History noted Dr. Nyoka Cowden    GI/Hepatic Neg liver ROS, History noted Dr. Nyoka Cowden   Endo/Other  negative endocrine ROS  Renal/GU Renal disease     Musculoskeletal   Abdominal   Peds  Hematology   Anesthesia Other Findings   Reproductive/Obstetrics                            Anesthesia Physical Anesthesia Plan  ASA: 3  Anesthesia Plan: General   Post-op Pain Management:    Induction: Intravenous  PONV Risk Score and Plan: 3 and Ondansetron and Dexamethasone  Airway Management Planned: Oral ETT  Additional Equipment:   Intra-op Plan:   Post-operative Plan: Possible Post-op intubation/ventilation  Informed Consent: I have reviewed the patients History and Physical, chart, labs and discussed the procedure including the risks, benefits and alternatives for the proposed anesthesia with the patient or authorized representative who has indicated his/her understanding and acceptance.   Patient has DNR.  Suspend DNR.   Dental advisory given  Plan Discussed with: CRNA and Anesthesiologist  Anesthesia Plan Comments:        Anesthesia Quick Evaluation

## 2021-11-19 NOTE — Transfer of Care (Signed)
Immediate Anesthesia Transfer of Care Note  Patient: Caitlyn Braun  Procedure(s) Performed: Procedure(s): ARTHROPLASTY BIPOLAR HIP (HEMIARTHROPLASTY), Right wrist closed reduction (Right)  Patient Location: PACU  Anesthesia Type:General  Level of Consciousness: Alert, Awake, Oriented  Airway & Oxygen Therapy: Patient Spontanous Breathing  Post-op Assessment: Report given to RN  Post vital signs: Reviewed and stable  Last Vitals:  Vitals:   11/19/21 0648 11/19/21 1307  BP: (!) 159/87 (!) 160/69  Pulse: 71 77  Resp: 17 15  Temp: 36.8 C 36.7 C  SpO2: 31% 59%    Complications: No apparent anesthesia complications

## 2021-11-19 NOTE — H&P (View-Only) (Signed)
ORTHOPAEDIC CONSULTATION  REQUESTING PHYSICIAN: Jerald Kief, MD  PCP:  Patient, No Pcp Per  Chief Complaint: right hip injury, right wrist injury from fall.   HPI: Caitlyn Braun is a 88 y.o. female with PMH of hypertension, hearing loss, noted to apparently be home with hospice presenting with altered mental status and unwitnessed falls.  Patient unable to provide history per admitting physician and information taken from the chart. Patient noted to have tried to leave her bed had an unwitnessed fall with complaints of right hip pain and right wrist pain EMS alerted brought to the ED.  X-rays showed right femoral neck fracture, severely displaced and commuted distal right radial and ulnar fractures. Patient placed empirically on IV antibiotics, admitted, orthopedics consulted.   Su Grand PA-C spoke with patient's son over the phone and plan was right hip hemiarthroplasty, repeat right distal radius and ulna closed reduction while under anesthesia for palliative purposes for pain control and to allow mobility. At baseline she does not lay in a bed but she does need significant help for mobility. She need 2 person assistance with or lift to transfer bed to wheelchair. She uses a wheelchair at baseline and is unable to navigate this on her own. Her right hand has previously been her strongest hand.      Past Medical History:  Diagnosis Date   Hearing loss    Hypertension    Past Surgical History:  Procedure Laterality Date   JOINT REPLACEMENT     Social History   Socioeconomic History   Marital status: Widowed    Spouse name: Not on file   Number of children: Not on file   Years of education: Not on file   Highest education level: Not on file  Occupational History   Not on file  Tobacco Use   Smoking status: Never   Smokeless tobacco: Never  Substance and Sexual Activity   Alcohol use: Never   Drug use: Never   Sexual activity: Not on file  Other Topics Concern    Not on file  Social History Narrative   Not on file   Social Determinants of Health   Financial Resource Strain: Not on file  Food Insecurity: Not on file  Transportation Needs: Not on file  Physical Activity: Not on file  Stress: Not on file  Social Connections: Not on file   History reviewed. No pertinent family history. No Known Allergies Prior to Admission medications   Medication Sig Start Date End Date Taking? Authorizing Provider  acetaminophen (TYLENOL) 500 MG tablet Take 1,000 mg by mouth every 12 (twelve) hours as needed for headache.   Yes [provider]  loperamide (IMODIUM) 2 MG capsule Take 1 capsule (2 mg total) by mouth as needed for diarrhea or loose stools. 05/23/21  Yes Kathlen Mody, MD   DG Knee Right Port  Result Date: 11/19/2021 CLINICAL DATA:  Right femoral neck fracture EXAM: PORTABLE RIGHT KNEE - 1-2 VIEW COMPARISON:  None Available. FINDINGS: Negative for fracture. There is mild lateral subluxation of the tibial plateau with respect to the femoral condyles presumably degenerative. There is moderate narrowing of the articular cartilage in the medial compartment of the knee, and marked narrowing in the lateral compartment with near bone on bone apposition. Marginal spurring about both compartments. Moderately large knee effusion. IMPRESSION: 1. Negative for fracture. 2. Advanced DJD, with large knee effusion. Electronically Signed   By: Corlis Leak M.D.   On: 11/19/2021 08:25  DG Wrist Complete Right  Result Date: 11/17/2021 CLINICAL DATA:  Status post reduction of right wrist. EXAM: RIGHT WRIST - COMPLETE 3+ VIEW COMPARISON:  Right wrist radiographs earlier today FINDINGS: Assessment is limited by overlying splint material. Position and alignment of previously described distal radius and ulnar fractures is improved. There is mild residual lateral displacement of the distal ulna fracture. There is moderate residual lateral displacement, dorsal  displacement, and impaction of the distal radius fracture. Soft tissue swelling is noted about the wrist. IMPRESSION: Improved alignment of distal radius and ulnar fractures with residual displacement as above. Electronically Signed   By: Sebastian Ache M.D.   On: 11/17/2021 16:33   CT Head Wo Contrast  Result Date: 11/17/2021 CLINICAL DATA:  Unwitnessed fall. EXAM: CT HEAD WITHOUT CONTRAST CT CERVICAL SPINE WITHOUT CONTRAST TECHNIQUE: Multidetector CT imaging of the head and cervical spine was performed following the standard protocol without intravenous contrast. Multiplanar CT image reconstructions of the cervical spine were also generated. RADIATION DOSE REDUCTION: This exam was performed according to the departmental dose-optimization program which includes automated exposure control, adjustment of the mA and/or kV according to patient size and/or use of iterative reconstruction technique. COMPARISON:  September 04, 2019. FINDINGS: CT HEAD FINDINGS Brain: No evidence of acute infarction, hemorrhage, hydrocephalus, extra-axial collection or mass lesion/mass effect. Vascular: No hyperdense vessel or unexpected calcification. Skull: Normal. Negative for fracture or focal lesion. Sinuses/Orbits: No acute finding. Other: None. CT CERVICAL SPINE FINDINGS Alignment: Minimal grade 1 anterolisthesis of C3-4 and C4-5 is noted secondary to posterior facet joint hypertrophy. Skull base and vertebrae: No acute fracture. No primary bone lesion or focal pathologic process. Soft tissues and spinal canal: No prevertebral fluid or swelling. No visible canal hematoma. Disc levels: Moderate degenerative disc disease is noted at C5-6 and C6-7. Upper chest: Negative. Other: Degenerative changes seen involving the bilateral facet joints. IMPRESSION: No acute intracranial abnormality seen. Multilevel degenerative changes are noted in the cervical spine. No acute abnormality seen. Electronically Signed   By: Lupita Raider M.D.   On:  11/17/2021 14:19   CT Cervical Spine Wo Contrast  Result Date: 11/17/2021 CLINICAL DATA:  Unwitnessed fall. EXAM: CT HEAD WITHOUT CONTRAST CT CERVICAL SPINE WITHOUT CONTRAST TECHNIQUE: Multidetector CT imaging of the head and cervical spine was performed following the standard protocol without intravenous contrast. Multiplanar CT image reconstructions of the cervical spine were also generated. RADIATION DOSE REDUCTION: This exam was performed according to the departmental dose-optimization program which includes automated exposure control, adjustment of the mA and/or kV according to patient size and/or use of iterative reconstruction technique. COMPARISON:  September 04, 2019. FINDINGS: CT HEAD FINDINGS Brain: No evidence of acute infarction, hemorrhage, hydrocephalus, extra-axial collection or mass lesion/mass effect. Vascular: No hyperdense vessel or unexpected calcification. Skull: Normal. Negative for fracture or focal lesion. Sinuses/Orbits: No acute finding. Other: None. CT CERVICAL SPINE FINDINGS Alignment: Minimal grade 1 anterolisthesis of C3-4 and C4-5 is noted secondary to posterior facet joint hypertrophy. Skull base and vertebrae: No acute fracture. No primary bone lesion or focal pathologic process. Soft tissues and spinal canal: No prevertebral fluid or swelling. No visible canal hematoma. Disc levels: Moderate degenerative disc disease is noted at C5-6 and C6-7. Upper chest: Negative. Other: Degenerative changes seen involving the bilateral facet joints. IMPRESSION: No acute intracranial abnormality seen. Multilevel degenerative changes are noted in the cervical spine. No acute abnormality seen. Electronically Signed   By: Lupita Raider M.D.   On: 11/17/2021 14:19  DG Hip Unilat W or Wo Pelvis 2-3 Views Right  Result Date: 11/17/2021 CLINICAL DATA:  Unwitnessed fall. EXAM: DG HIP (WITH OR WITHOUT PELVIS) 2-3V RIGHT COMPARISON:  May 18, 2021. FINDINGS: Moderately displaced proximal right  femoral neck fracture is noted. Status post left hip arthroplasty. IMPRESSION: Moderately displaced proximal right femoral neck fracture. Electronically Signed   By: Marijo Conception M.D.   On: 11/17/2021 13:47   DG Wrist Complete Right  Result Date: 11/17/2021 CLINICAL DATA:  Unwitnessed fall. EXAM: RIGHT WRIST - COMPLETE 3+ VIEW COMPARISON:  None Available. FINDINGS: Probable comminuted distal right radial and ulnar fractures are noted with severe dorsal displacement of distal fracture fragments. IMPRESSION: Severely displaced and comminuted distal right radial and ulnar fractures. Electronically Signed   By: Marijo Conception M.D.   On: 11/17/2021 13:46   DG Chest Portable 1 View  Result Date: 11/17/2021 CLINICAL DATA:  Pt BIB [facility] form [facility] for an unwitnessed fall. Pt has deformity of the right wrist, possible injury to right hip. Splint to wrist, pelvic binding, HTNfall, hip fx, ams EXAM: PORTABLE CHEST 1 VIEW COMPARISON:  05/18/2021 FINDINGS: Normal cardiac silhouette ectatic aorta. LEFT lobe density new from comparison exam. Low lung volumes. No pneumothorax. No pulmonary edema. No acute osseous abnormality. IMPRESSION: LEFT lobe atelectasis versus infiltrate. Electronically Signed   By: Suzy Bouchard M.D.   On: 11/17/2021 13:44    Positive ROS: All other systems have been reviewed and were otherwise negative with the exception of those mentioned in the HPI and as above.  Physical Exam: General: No acute distress. Physical exam limited due to confusion and not wanting to stay awake for exam.  Cardiovascular: No pedal edema Respiratory: No cyanosis, no use of accessory musculature GI: No organomegaly, abdomen is soft and non-tender Skin: No lesions in the area of chief complaint Neurologic:  Psychiatric: Patient is sleeping tried to wake up for exam. She kept falling back asleep. She is oriented to her name and birth month and day only.  Lymphatic: No axillary or cervical  lymphadenopathy  MUSCULOSKELETAL:  Exam limited due to patient not following commands. Examination of the right hip reveals no skin wounds or lesions. Extremity externally rotated and slightly shortened. Pain with palpation over lateral hip and with motion. She did respond to firm pressure on her foot and noted plantar flexion and dorsiflexion intact in RLE. Unable to palpate right pedal pulses but were present with doppler. Capillary refill <2 seconds in LE bilaterally. No significant pedal edema. Compartment soft.   Examination of the right wrist. She is in a splint and propped up on a pillow with ice. Capillary refill <2 seconds. Fingers and hand swollen. Bruising over fingers. She would not move fingers on command. Unable to assess sensation due to mental status.    Assessment: Displaced right femoral neck fracture.  Distal radius and ulnar fracture.   Plan: She has an unstable right femoral neck fracture. Plan for right hip hemiarthroplasty, repeat right distal radius and ulna closed reduction today while under anesthesia for palliative purposes for pain control and to allow mobility. She has already been admitted to the hospitalist service for perioperative medical optimization. She has been NPO since midnight. Continue to hold chemical DVT ppx.     Charlott Rakes, PA-C    11/19/2021 12:05 PM

## 2021-11-19 NOTE — Anesthesia Postprocedure Evaluation (Signed)
Anesthesia Post Note  Patient: Caitlyn Braun  Procedure(s) Performed: ARTHROPLASTY BIPOLAR HIP (HEMIARTHROPLASTY), Right wrist closed reduction (Right: Hip)     Patient location during evaluation: PACU Anesthesia Type: General Level of consciousness: awake Pain management: pain level controlled Vital Signs Assessment: post-procedure vital signs reviewed and stable Respiratory status: spontaneous breathing, nonlabored ventilation and respiratory function stable Cardiovascular status: blood pressure returned to baseline and stable Postop Assessment: no apparent nausea or vomiting Anesthetic complications: no   No notable events documented.  Last Vitals:  Vitals:   11/19/21 1745 11/19/21 1812  BP: (!) 142/60 (!) 134/58  Pulse: 65 66  Resp: 13 17  Temp:  36.5 C  SpO2: 99% 100%    Last Pain:  Vitals:   11/19/21 1812  TempSrc: Oral  PainSc:                  Audry Pili

## 2021-11-20 DIAGNOSIS — W19XXXA Unspecified fall, initial encounter: Secondary | ICD-10-CM | POA: Diagnosis not present

## 2021-11-20 DIAGNOSIS — G9340 Encephalopathy, unspecified: Secondary | ICD-10-CM

## 2021-11-20 DIAGNOSIS — J189 Pneumonia, unspecified organism: Secondary | ICD-10-CM | POA: Diagnosis not present

## 2021-11-20 LAB — COMPREHENSIVE METABOLIC PANEL
ALT: 18 U/L (ref 0–44)
AST: 24 U/L (ref 15–41)
Albumin: 2.6 g/dL — ABNORMAL LOW (ref 3.5–5.0)
Alkaline Phosphatase: 70 U/L (ref 38–126)
Anion gap: 10 (ref 5–15)
BUN: 33 mg/dL — ABNORMAL HIGH (ref 8–23)
CO2: 23 mmol/L (ref 22–32)
Calcium: 8.5 mg/dL — ABNORMAL LOW (ref 8.9–10.3)
Chloride: 103 mmol/L (ref 98–111)
Creatinine, Ser: 0.86 mg/dL (ref 0.44–1.00)
GFR, Estimated: >60 mL/min (ref >60)
Glucose, Bld: 134 mg/dL — ABNORMAL HIGH (ref 70–99)
Potassium: 4 mmol/L (ref 3.5–5.1)
Sodium: 136 mmol/L (ref 135–145)
Total Bilirubin: 0.4 mg/dL (ref 0.3–1.2)
Total Protein: 5.6 g/dL — ABNORMAL LOW (ref 6.5–8.1)

## 2021-11-20 LAB — CBC
HCT: 27.9 % — ABNORMAL LOW (ref 36.0–46.0)
Hemoglobin: 8.9 g/dL — ABNORMAL LOW (ref 12.0–15.0)
MCH: 28.3 pg (ref 26.0–34.0)
MCHC: 31.9 g/dL (ref 30.0–36.0)
MCV: 88.6 fL (ref 80.0–100.0)
Platelets: 159 10*3/uL (ref 150–400)
RBC: 3.15 MIL/uL — ABNORMAL LOW (ref 3.87–5.11)
RDW: 17.3 % — ABNORMAL HIGH (ref 11.5–15.5)
WBC: 9.6 10*3/uL (ref 4.0–10.5)
nRBC: 0 % (ref 0.0–0.2)

## 2021-11-20 LAB — LEGIONELLA PNEUMOPHILA SEROGP 1 UR AG: L. pneumophila Serogp 1 Ur Ag: NEGATIVE

## 2021-11-20 MED ORDER — ENOXAPARIN SODIUM 30 MG/0.3ML IJ SOSY
30.0000 mg | PREFILLED_SYRINGE | INTRAMUSCULAR | Status: DC
  Administered 2021-11-20 – 2021-11-25 (×5): 30 mg via SUBCUTANEOUS
  Filled 2021-11-20 (×6): qty 0.3

## 2021-11-20 NOTE — Progress Notes (Signed)
  Progress Note   Patient: Caitlyn Braun PYK:[id number] DOB: Dec 01, 1922 DOA: 11/17/2021     3 DOS: the patient was seen and examined on 11/20/2021   Brief hospital course: 88 year old female history of hypertension, hearing loss, noted to apparently be home with hospice presenting with altered mental status and unwitnessed falls.  Patient unable to provide history per admitting physician and information taken from the chart which is noted that patient did have some hallucinations and confusions 3 days prior to admission.  Patient noted to have tried to leave her bed had an unwitnessed fall with complaints of right hip pain and right wrist pain EMS alerted brought to the ED.  Imaging done consistent with proximal right femoral neck fracture, severely displaced and commuted distal right radial and ulnar fractures.  Chest x-ray concerning for atelectasis versus infiltrate.  Patient placed empirically on IV antibiotics, admitted, orthopedics consulted who spoke with patient's son over the phone and plan was right hip hemiarthroplasty, repeat right distal radius and ulna closed reduction while under anesthesia for palliative purposes for pain control and to allow mobility.  Assessment and Plan: #1 proximal right femoral neck fracture/severely displaced and commuted distal right radial and ulnar fractures -Secondary to mechanical fall. -Orthopedic Surgery was consulted. Pt now s/p surgery 11/19/21 -f/u on PT/OT recs for SNF. TOC following -Cont analgesia as needed   2.  Left lower lobe pneumonia -Chest x-ray done concerning for atelectasis versus infiltrate. -Noted with a leukocytosis. -Pt was continued on empiric IV Rocephin, IV azithromycin for coverage for pneumonia. -Leukocytosis normalized   3.  Hypertension -Not on medications per med rec. -Continue current regimen of hydralazine 25 mg 3 times daily. -BP remained stable   4.  Acute metabolic encephalopathy -Likely secondary to left lower  lobe pneumonia in the setting of pain from right femoral neck fracture and distal right radial and ulnar fractures. -Continue empiric antibiotics. -Gentle hydration. -Supportive care.   5.  Normocytic anemia -No overt bleeding. -Hemodynamically stable      Subjective: No complaints this AM. Staff reports pt had been uncooperative with therapy. Seen with staff assisting with breakfast  Physical Exam: Vitals:   11/20/21 0126 11/20/21 0502 11/20/21 1002 11/20/21 1401  BP: (!) 149/60 138/64 (!) 148/54 (!) 135/59  Pulse: 61 68 67 63  Resp: 17 17 15 17   Temp: (!) 97.5 F (36.4 C) 97.7 F (36.5 C) 97.8 F (36.6 C) 97.8 F (36.6 C)  TempSrc: Oral Oral    SpO2: 100% 100% 92% 94%  Weight:      Height:       General exam: Conversant, in no acute distress Respiratory system: normal chest rise, clear, no audible wheezing Cardiovascular system: regular rhythm, s1-s2 Gastrointestinal system: Nondistended, nontender, pos BS Central nervous system: No seizures, no tremors Extremities: No cyanosis, no joint deformities Skin: No rashes, no pallor Psychiatry: Affect normal // no auditory hallucinations   Data Reviewed:  Labs reviewed: Na 136, K 4.0, Cr 0.86, Hgb 8.9  Family Communication: Pt in room, family not at bedside  Disposition: Status is: Inpatient Remains inpatient appropriate because: Severity of illness  Planned Discharge Destination: Skilled nursing facility    Author: Marylu Lund, MD 11/20/2021 5:00 PM  For on call review [email or web address].

## 2021-11-20 NOTE — Progress Notes (Signed)
    Subjective: Patient pleasantly confused this morning. Dementia at baseline. Exam limited due to dementia.  Patient does not appear to be in pain. No grimacing during examination.  No reported events overnight.   Objective:   VITALS:   Vitals:   11/19/21 2200 11/20/21 0126 11/20/21 0502 11/20/21 1002  BP:  (!) 149/60 138/64 (!) 148/54  Pulse:  61 68 67  Resp:  17 17 15   Temp: (!) 97.5 F (36.4 C) (!) 97.5 F (36.4 C) 97.7 F (36.5 C) 97.8 F (36.6 C)  TempSrc:  Oral Oral   SpO2:  100% 100% 92%  Weight:      Height:        Patient sleeping when entering room Woken up for examination. NAD. Alert. Not oriented x3 today. Pleasantly confused this morning.  ABD soft Neurovascular intact Dorsiflexion/Plantar flexion intact Incision: dressing C/D/I No cellulitis present Compartment soft Distal pedal pulses barely palpable in RLE today. Improved since yesterday as doppler had to be used.  Unable to assess sensation due to patients mental status but she does respond to light touch.  No facial grimacing with log rolling of her hip.   RUE: Splint and sling in place. Bruising and swelling over fingers. Continue to elevate with pillow and ice for swelling and pain control. Patient confused and not moving her fingers on command this morning, nurse states she was able to get her to move her fingers this morning. Capillary refill < 2 seconds.    Lab Results  Component Value Date   WBC 9.6 11/20/2021   HGB 8.9 (L) 11/20/2021   HCT 27.9 (L) 11/20/2021   MCV 88.6 11/20/2021   PLT 159 11/20/2021   BMET    Component Value Date/Time   NA 136 11/20/2021 0341   K 4.0 11/20/2021 0341   CL 103 11/20/2021 0341   CO2 23 11/20/2021 0341   GLUCOSE 134 (H) 11/20/2021 0341   BUN 33 (H) 11/20/2021 0341   CREATININE 0.86 11/20/2021 0341   CALCIUM 8.5 (L) 11/20/2021 0341   GFRNONAA >60 11/20/2021 0341     Assessment/Plan: 1 Day Post-Op   Principal Problem:   Closed right hip  fracture (HCC) Active Problems:   Normocytic anemia   Essential hypertension   PNA (pneumonia)   Right wrist fracture   Fall at home, initial encounter   Acute metabolic encephalopathy   Community acquired pneumonia of left lower lobe of lung   WBAT with walker DVT ppx: Lovenox, SCDs, TEDS PO pain control PT/OT Dispo: Patient under care of the medical team, disposition per their recommendation. Continue to ice and elevate RUE to help with swelling in hand and fingers.    Charlott Rakes, PA-C 11/20/2021, 10:40 AM   [facility]  [place] [address], [address], [place], [place] [address] Phone: [phone] [email or web address] Facebook  [employer]

## 2021-11-20 NOTE — NC FL2 (Signed)
[place] MEDICAID FL2 LEVEL OF CARE SCREENING TOOL     IDENTIFICATION  Patient Name: Caitlyn Braun Birthdate: [birth year] Sex: female Admission Date (Current Location): 11/17/2021  [facility] and [place] Number:  Herbalist and Address:  [facility],  [place], [place]      Provider Number: [id number]  Attending Physician Name and Address:  Donne Hazel, MD  Relative Name and Phone Number:  son, Shambhavi Salley @ [phone]    Current Level of Care: Hospital Recommended Level of Care: [place] Prior Approval Number:    Date Approved/Denied:   PASRR Number: [id number]  Discharge Plan: SNF    Current Diagnoses: Patient Active Problem List   Diagnosis Date Noted   Community acquired pneumonia of left lower lobe of lung    Closed right hip fracture ([place]) 11/17/2021   PNA (pneumonia) 11/17/2021   Right wrist fracture 11/17/2021   Fall at home, initial encounter 70/26/3785   Acute metabolic encephalopathy 88/50/2774   Goals of care, counseling/discussion    Pressure injury of skin 05/20/2021   Syncope and collapse 05/19/2021   Iron deficiency anemia    Heme positive stool    B12 deficiency    Normocytic anemia 05/18/2021   Constipation 05/18/2021   Essential hypertension 05/18/2021   AKI (acute kidney injury) ([place]) 05/18/2021   Postural dizziness with presyncope 05/18/2021   Breast mass, right 05/18/2021   Bilateral leg edema 05/18/2021   Abdominal pain 05/18/2021   Colitis 05/18/2021   Weakness 09/02/2019   URI (upper respiratory infection) 09/02/2019    Orientation RESPIRATION BLADDER Height & Weight     Self, Place  Normal Incontinent, External catheter (currently with purewick) Weight: 132 lb 4.4 oz (60 kg) Height:  5\' 3"  (160 cm)  BEHAVIORAL SYMPTOMS/MOOD NEUROLOGICAL BOWEL NUTRITION STATUS      Incontinent Diet (Regular)  AMBULATORY STATUS COMMUNICATION OF NEEDS Skin   Extensive Assist  Verbally Other (Comment) (surgical incision)                       Personal Care Assistance Level of Assistance  Bathing, Dressing Bathing Assistance: Limited assistance   Dressing Assistance: Limited assistance     Functional Limitations Info             [place]  PT (By licensed PT), OT (By licensed OT)     PT Frequency: 5x/wk OT Frequency: 5x/wk            Contractures Contractures Info: Not present    Additional Factors Info  Code Status, Allergies Code Status Info: DNR Allergies Info: NKDA           Current Medications (11/20/2021):  This is the current hospital active medication list Current Facility-Administered Medications  Medication Dose Route Frequency Provider Last Rate Last Admin   albuterol (PROVENTIL) (2.5 MG/3ML) 0.083% nebulizer solution 2.5 mg  2.5 mg Nebulization Q6H PRN Swinteck, Aaron Edelman, MD       azithromycin (ZITHROMAX) 500 mg in sodium chloride 0.9 % 250 mL IVPB  500 mg Intravenous Q24H Swinteck, Aaron Edelman, MD   Stopped at 11/18/21 1519   cefTRIAXone (ROCEPHIN) 1 g in sodium chloride 0.9 % 100 mL IVPB  1 g Intravenous Q24H Rod Can, MD 200 mL/hr at 11/20/21 1527 1 g at 11/20/21 1527   docusate sodium (COLACE) capsule 100 mg  100 mg Oral BID Rod Can, MD   100 mg at 11/19/21 2146   enoxaparin (  LOVENOX) injection 30 mg  30 mg Subcutaneous Q24H Hill, Avery S, PA-C   30 mg at 11/20/21 1120   feeding supplement (ENSURE ENLIVE / ENSURE PLUS) liquid 237 mL  237 mL Oral Q24H Swinteck, Aaron Edelman, MD       guaiFENesin (MUCINEX) 12 hr tablet 600 mg  600 mg Oral BID Rod Can, MD   600 mg at 11/19/21 2146   hydrALAZINE (APRESOLINE) injection 10 mg  10 mg Intravenous Q8H PRN Swinteck, Aaron Edelman, MD       hydrALAZINE (APRESOLINE) tablet 25 mg  25 mg Oral Q8H Swinteck, Aaron Edelman, MD   25 mg at 11/20/21 1527   HYDROcodone-acetaminophen (NORCO/VICODIN) 5-325 MG per tablet 1-2 tablet  1-2 tablet Oral Q6H PRN Rod Can, MD   1  tablet at 11/18/21 0532   menthol-cetylpyridinium (CEPACOL) lozenge 3 mg  1 lozenge Oral PRN Swinteck, Aaron Edelman, MD       Or   phenol (CHLORASEPTIC) mouth spray 1 spray  1 spray Mouth/Throat PRN Swinteck, Aaron Edelman, MD       metoCLOPramide (REGLAN) tablet 5-10 mg  5-10 mg Oral Q8H PRN Swinteck, Aaron Edelman, MD       Or   metoCLOPramide (REGLAN) injection 5-10 mg  5-10 mg Intravenous Q8H PRN Swinteck, Aaron Edelman, MD       morphine (PF) 2 MG/ML injection 0.5 mg  0.5 mg Intravenous Q2H PRN Swinteck, Aaron Edelman, MD       ondansetron (ZOFRAN) tablet 4 mg  4 mg Oral Q6H PRN Swinteck, Aaron Edelman, MD       Or   ondansetron (ZOFRAN) injection 4 mg  4 mg Intravenous Q6H PRN Swinteck, Aaron Edelman, MD       Oral care mouth rinse  15 mL Mouth Rinse PRN Swinteck, Aaron Edelman, MD       Oral care mouth rinse  15 mL Mouth Rinse PRN Swinteck, Aaron Edelman, MD       polyethylene glycol (MIRALAX / GLYCOLAX) packet 17 g  17 g Oral Daily PRN Swinteck, Aaron Edelman, MD       senna Donavan Burnet) tablet 8.6 mg  1 tablet Oral BID Rod Can, MD   8.6 mg at 11/19/21 2146     Discharge Medications: Please see discharge summary for a list of discharge medications.  Relevant Imaging Results:  Relevant Lab Results:   Additional Information ssn#[id number]  Chayden Garrelts, LCSW

## 2021-11-20 NOTE — Evaluation (Signed)
Physical Therapy Evaluation Patient Details Name: Caitlyn Braun MRN: [id number] DOB: 07/07/1922 Today's Date: 11/20/2021  History of Present Illness  88 y.o. female admitted 11/17/21 with a fall, dx of R hip fracture and R wrist fracture, s/p R hip hemiarthroplasty anterior approach. PMH: HTN, HOH.  Clinical Impression  Pt admitted with above diagnosis. +2 total assist for supine to sit, min A for sitting balance 2* posterior lean, +2 total assist for stand pivot transfer to recliner. Pad for maximove left under pt for nursing to use for transfer to bed. SNF recommended.  Pt currently with functional limitations due to the deficits listed below (see PT Problem List). Pt will benefit from skilled PT to increase their independence and safety with mobility to allow discharge to the venue listed below.          Recommendations for follow up therapy are one component of a multi-disciplinary discharge planning process, led by the attending physician.  Recommendations may be updated based on patient status, additional functional criteria and insurance authorization.  Follow Up Recommendations Skilled nursing-short term rehab (<3 hours/day) Can patient physically be transported by private vehicle: No    Assistance Recommended at Discharge Frequent or constant Supervision/Assistance  Patient can return home with the following  Two people to help with walking and/or transfers;A lot of help with bathing/dressing/bathroom;Assistance with cooking/housework;Direct supervision/assist for medications management;Assist for transportation;Help with stairs or ramp for entrance    Equipment Recommendations None recommended by PT  Recommendations for Other Services       Functional Status Assessment Patient has had a recent decline in their functional status and demonstrates the ability to make significant improvements in function in a reasonable and predictable amount of time.     Precautions /  Restrictions Precautions Precautions: Fall Required Braces or Orthoses: Sling Restrictions RUE Weight Bearing: Non weight bearing RLE Weight Bearing: Weight bearing as tolerated      Mobility  Bed Mobility Overal bed mobility: Needs Assistance Bed Mobility: Supine to Sit     Supine to sit: +2 for safety/equipment, Total assist     General bed mobility comments: assist to raise trunk and pivot hips to EOB, pt able to assist with advancing LLE towards EOB    Transfers Overall transfer level: Needs assistance Equipment used: 2 person hand held assist Transfers: Sit to/from Stand, Bed to chair/wheelchair/BSC Sit to Stand: +2 physical assistance, Total assist Stand pivot transfers: Total assist, +2 physical assistance         General transfer comment: assist to rise and to pivot to recliner; SaO2 90% on room air at end of session, applied 2L O2    Ambulation/Gait               General Gait Details: unable, pt non ambulatory at baseline  Stairs            Wheelchair Mobility    Modified Rankin (Stroke Patients Only)       Balance Overall balance assessment: Needs assistance Sitting-balance support: Feet supported, No upper extremity supported Sitting balance-Leahy Scale: Poor Sitting balance - Comments: min A for posterior lean, able to maintain static sitting with LUE gripping bed rail Postural control: Posterior lean   Standing balance-Leahy Scale: Zero                               Pertinent Vitals/Pain Pain Assessment Pain Assessment: Faces Faces Pain Scale: No hurt Breathing: normal Negative Vocalization:  none Facial Expression: smiling or inexpressive Body Language: relaxed Consolability: no need to console PAINAD Score: 0    Home Living Family/patient expects to be discharged to:: Skilled nursing facility                        Prior Function Prior Level of Function : Patient poor historian/Family not  available             Mobility Comments: use RW occasionally to stand up with assist; mostly wheelchair bound. Requires assist to transfer to wheelchair (per OT eval 05/19/21) ADLs Comments: PCAs assist with dressing and bathing her back side. She feeds herself when wheeled to dining room (per OT eval 05/19/21)     Hand Dominance        Extremity/Trunk Assessment   Upper Extremity Assessment Upper Extremity Assessment: Defer to OT evaluation    Lower Extremity Assessment Lower Extremity Assessment: Difficult to assess due to impaired cognition;Generalized weakness;RLE deficits/detail RLE Deficits / Details: able to perform LAQ actively sitting at EOB    Cervical / Trunk Assessment Cervical / Trunk Assessment: Kyphotic  Communication   Communication: HOH  Cognition Arousal/Alertness: Awake/alert Behavior During Therapy: WFL for tasks assessed/performed Overall Cognitive Status: No family/caregiver present to determine baseline cognitive functioning                                 General Comments: oriented to self and location, not to year nor situation        General Comments      Exercises General Exercises - Lower Extremity Ankle Circles/Pumps: AROM, Both, 15 reps, Supine Long Arc Quad: AROM, Both, 5 reps, Seated   Assessment/Plan    PT Assessment Patient needs continued PT services  PT Problem List Decreased mobility;Decreased activity tolerance;Decreased balance;Pain;Decreased strength       PT Treatment Interventions Therapeutic activities;Therapeutic exercise;Functional mobility training;Balance training;Patient/family education    PT Goals (Current goals can be found in the Care Plan section)  Acute Rehab PT Goals PT Goal Formulation: Patient unable to participate in goal setting Time For Goal Achievement: 12/04/21 Potential to Achieve Goals: Fair    Frequency Min 3X/week     Co-evaluation PT/OT/SLP Co-Evaluation/Treatment:  Yes Reason for Co-Treatment: For patient/therapist safety;Complexity of the patient's impairments (multi-system involvement) PT goals addressed during session: Mobility/safety with mobility;Balance;Strengthening/ROM         AM-PAC PT "6 Clicks" Mobility  Outcome Measure Help needed turning from your back to your side while in a flat bed without using bedrails?: Total Help needed moving from lying on your back to sitting on the side of a flat bed without using bedrails?: Total Help needed moving to and from a bed to a chair (including a wheelchair)?: Total Help needed standing up from a chair using your arms (e.g., wheelchair or bedside chair)?: Total Help needed to walk in hospital room?: Total Help needed climbing 3-5 steps with a railing? : Total 6 Click Score: 6    End of Session Equipment Utilized During Treatment: Gait belt;Oxygen Activity Tolerance: Patient tolerated treatment well Patient left: with chair alarm set;in chair;with call bell/phone within reach Nurse Communication: Mobility status;Need for lift equipment PT Visit Diagnosis: Other abnormalities of gait and mobility (R26.89);History of falling (Z91.81);Muscle weakness (generalized) (M62.81)    Time: 0177-9390 PT Time Calculation (min) (ACUTE ONLY): 29 min   Charges:   PT Evaluation $PT Eval Moderate Complexity: 1  Mod          Ralene Bathe Kistler PT 11/20/2021  Acute Rehabilitation Services  Office [phone]

## 2021-11-20 NOTE — TOC Initial Note (Signed)
Transition of Care [facility]) - Initial/Assessment Note    Patient Details  Name: Caitlyn Braun MRN: [id number] Date of Birth: 1923/01/01  Transition of Care [facility]) CM/SW Contact:    Lennart Pall, LCSW Phone Number: 11/20/2021, 3:22 PM  Clinical Narrative:                 Met with pt and son yesterday and today to review dc planning needs.  Son confirms pt was admitted after a fall at her ALF/ [facility] in [place].  Notes she was mobile at wheelchair level, however, son and facility confirm she was able to do stand -pivot transfers with CG/min assist and she is currently requiring +2 total per PT eval.  ALF cannot meet this level of assistance need and therapy is recommending SNF rehab prior to her return to ALF.  Pt/ son in agreement with this plan.  Will begin SNF bed search and insurance authorization.  Expected Discharge Plan: Skilled Nursing Facility Barriers to Discharge: Continued Medical Work up   Patient Goals and CMS Choice        Expected Discharge Plan and Services Expected Discharge Plan: [place] In-house Referral: Clinical Social Work     Living arrangements for the past 2 months: [place] ([place])                                      Prior Living Arrangements/Services Living arrangements for the past 2 months: [place] ([place]) Lives with:: Facility Resident Patient language and need for interpreter reviewed:: Yes        Need for Family Participation in Patient Care: No (Comment) Care giver support system in place?: Yes (comment)   Criminal Activity/Legal Involvement Pertinent to Current Situation/Hospitalization: No - Comment as needed  Activities of Daily Living Home Assistive Devices/Equipment: Wheelchair (per previous hx) ADL Screening (condition at time of admission) Patient's cognitive ability adequate to safely complete daily activities?: No Is the patient deaf or have difficulty  hearing?: Yes Does the patient have difficulty seeing, even when wearing glasses/contacts?: No Does the patient have difficulty concentrating, remembering, or making decisions?: Yes Patient able to express need for assistance with ADLs?: Yes Does the patient have difficulty dressing or bathing?: Yes Independently performs ADLs?: No Communication: Independent Dressing (OT): Needs assistance Grooming: Needs assistance Feeding: Needs assistance Bathing: Needs assistance Toileting: Needs assistance In/Out Bed: Needs assistance Walks in Home: Dependent Does the patient have difficulty walking or climbing stairs?: Yes Weakness of Legs: Right Weakness of Arms/Hands: Right  Permission Sought/Granted Permission sought to share information with : Family Supports, Customer service manager Permission granted to share information with : Yes, Verbal Permission Granted  Share Information with NAME: Sherri Mcarthy     Permission granted to share info w Relationship: son  Permission granted to share info w Contact Information: [phone]  Emotional Assessment Appearance:: Appears stated age Attitude/Demeanor/Rapport: Gracious Affect (typically observed): Quiet Orientation: : Oriented to Self, Oriented to Place Alcohol / Substance Use: Not Applicable Psych Involvement: No (comment)  Admission diagnosis:  Encephalopathy [G93.40] Closed right hip fracture ([place]) [S72.001A] Fall, initial encounter [W19.XXXA] Closed fracture of right hip, initial encounter ([place]) [S72.001A] Closed fracture of right wrist, initial encounter [place] acquired pneumonia of left lower lobe of lung [J18.9] Patient Active Problem List   Diagnosis Date Noted   Community acquired pneumonia of left lower lobe of lung  Closed right hip fracture ([place]) 11/17/2021   PNA (pneumonia) 11/17/2021   Right wrist fracture 11/17/2021   Fall at home, initial encounter 50/15/8682   Acute metabolic  encephalopathy 57/49/3552   Goals of care, counseling/discussion    Pressure injury of skin 05/20/2021   Syncope and collapse 05/19/2021   Iron deficiency anemia    Heme positive stool    B12 deficiency    Normocytic anemia 05/18/2021   Constipation 05/18/2021   Essential hypertension 05/18/2021   AKI (acute kidney injury) ([place]) 05/18/2021   Postural dizziness with presyncope 05/18/2021   Breast mass, right 05/18/2021   Bilateral leg edema 05/18/2021   Abdominal pain 05/18/2021   Colitis 05/18/2021   Weakness 09/02/2019   URI (upper respiratory infection) 09/02/2019   PCP:  Patient, No Pcp Per Pharmacy:   [facility] ([place], [place] [address] [address] Phone: [phone] Fax: [phone]     Social Determinants of Health (SDOH) Interventions    Readmission Risk Interventions    11/20/2021    3:19 PM  Readmission Risk Prevention Plan  Transportation Screening Complete  PCP or Specialist Appt within 5-7 Days Complete  Home Care Screening Complete  Medication Review (RN CM) Complete

## 2021-11-20 NOTE — Evaluation (Signed)
Occupational Therapy Evaluation Patient Details Name: Caitlyn Braun MRN: [id number] DOB: 23-Apr-1922 Today's Date: 11/20/2021   History of Present Illness 88 y.o. female admitted 11/17/21 with a fall, dx of R hip fracture and R wrist fracture, s/p R hip hemiarthroplasty anterior approach. PMH: HTN, HOH.   Clinical Impression   Pt was admitted as above presenting with deficits as listed below (please refer to OT problem list). Pt is currently +2 total assist for supine to sit, min A for sitting balance 2* posterior lean, +2 total assist for stand pivot transfer to recliner. Per chart review, pt required +2 assist/lift use at ALF for transfers from bed to w/c, then pt was able to propel herself. She is currently with functional limitations due to NWB resirictions of her dominant RUE and RLE deficits following hip fracture. Pt will benefit from acute OT to assist in maximizing independence and safety with ADL's and self care, edema control of her RUE and active/passive ROM to her digits as well as functional mobility in preparation for d/c to SNF. Will follow acutely.   Recommendations for follow up therapy are one component of a multi-disciplinary discharge planning process, led by the attending physician.  Recommendations may be updated based on patient status, additional functional criteria and insurance authorization.   Follow Up Recommendations  Skilled nursing-short term rehab (<3 hours/day)    Assistance Recommended at Discharge Frequent or constant Supervision/Assistance  Patient can return home with the following Two people to help with walking and/or transfers;Assistance with cooking/housework;Help with stairs or ramp for entrance;Assist for transportation;Two people to help with bathing/dressing/bathroom;A lot of help with bathing/dressing/bathroom    Functional Status Assessment  Patient has had a recent decline in their functional status and demonstrates the ability to make  significant improvements in function in a reasonable and predictable amount of time.  Equipment Recommendations  Other (comment) (Defer to next venue)    Recommendations for Other Services       Precautions / Restrictions Precautions Precautions: Fall;Other (comment) Precaution Comments: NWB RUE Required Braces or Orthoses: Sling Splint/Cast - Date Prophylactic Dressing Applied (if applicable): 11/17/21 Restrictions Weight Bearing Restrictions: Yes RUE Weight Bearing: Non weight bearing RLE Weight Bearing: Weight bearing as tolerated      Mobility Bed Mobility Overal bed mobility: Needs Assistance Bed Mobility: Supine to Sit     Supine to sit: +2 for safety/equipment, Total assist     General bed mobility comments: assist to raise trunk and pivot hips to EOB, pt able to assist with advancing LLE towards EOB    Transfers Overall transfer level: Needs assistance Equipment used: 2 person hand held assist Transfers: Sit to/from Stand, Bed to chair/wheelchair/BSC Sit to Stand: +2 physical assistance, Total assist Stand pivot transfers: Total assist, +2 physical assistance         General transfer comment: assist to rise and to pivot to recliner; SaO2 90% on room air at end of session, applied 2L O2      Balance Overall balance assessment: Needs assistance Sitting-balance support: Feet supported, No upper extremity supported, Single extremity supported Sitting balance-Leahy Scale: Poor Sitting balance - Comments: min A for posterior lean, able to maintain static sitting with LUE gripping bed rail Postural control: Posterior lean   Standing balance-Leahy Scale: Zero       ADL either performed or assessed with clinical judgement   ADL Overall ADL's : Needs assistance/impaired Eating/Feeding: Set up;Bed level Eating/Feeding Details (indicate cue type and reason): Pt is RHD therefore whould benefit  from assist with set-up and sutting food etc. Grooming: Wash/dry  face;Brushing hair;Sitting;Min guard;Minimal assistance Grooming Details (indicate cue type and reason): Min guard asssit when sitting up at EOB. Upper Body Bathing: Minimal assistance;Bed level   Lower Body Bathing: Total assistance;Bed level   Upper Body Dressing : Minimal assistance;Bed level;Sitting   Lower Body Dressing: Total assistance;Sitting/lateral leans;Bed level;+2 for physical assistance;+2 for safety/equipment   Toilet Transfer: Total assistance;+2 for safety/equipment;+2 for physical assistance   Toileting- Clothing Manipulation and Hygiene: Total assistance;Bed level   Tub/ Shower Transfer: Total assistance;+2 for physical assistance;+2 for safety/equipment   Functional mobility during ADLs: Total assistance;+2 for physical assistance;+2 for safety/equipment General ADL Comments: Pt is +2 total assist for supine to sit, min A for sitting balance 2* posterior lean, +2 total assist for stand pivot transfer to recliner. Per chart review, pt required +2 assist at ALF for transfers from bed to w/c, then pt was able to propel herself. She is currently with functional limitations due to WB resirictions of dominant RUE and LE deficits. Pt will benefit from acute OT to assist in maximizing independence and safety with ADL's and self care as well as functional mobility for SNF.     Vision Baseline Vision/History: 0 No visual deficits Ability to See in Adequate Light: 0 Adequate Vision Assessment?: No apparent visual deficits            Pertinent Vitals/Pain Pain Assessment Pain Assessment: Faces Faces Pain Scale: No hurt Breathing: normal Negative Vocalization: none Facial Expression: smiling or inexpressive Body Language: relaxed Consolability: no need to console PAINAD Score: 0     Hand Dominance Right   Extremity/Trunk Assessment Upper Extremity Assessment Upper Extremity Assessment: RUE deficits/detail RUE Deficits / Details: NWB RUE - Communited distal radial  and ulnar fractures s/p closed reduction. Pt is RHD. Moderate edema of digits noted. PROM finger flexion and extension is WNL's RUE: Unable to fully assess due to immobilization RUE Coordination: decreased fine motor   Lower Extremity Assessment Lower Extremity Assessment: Defer to PT evaluation RLE Deficits / Details: able to perform LAQ actively sitting at EOB   Cervical / Trunk Assessment Cervical / Trunk Assessment: Kyphotic   Communication Communication Communication: HOH   Cognition Arousal/Alertness: Awake/alert Behavior During Therapy: WFL for tasks assessed/performed Overall Cognitive Status: No family/caregiver present to determine baseline cognitive functioning   General Comments: oriented to self and location, not to year nor situation                Home Living Family/patient expects to be discharged to:: Skilled nursing facility  Pt was admitted from ALF    Prior Functioning/Environment Prior Level of Function : Patient poor historian/Family not available  Chart review indicates that pt was often +2 or lift use for transfer from bed to w/c, then would propel herself.  Mobility Comments: use RW occasionally to stand up with assist; mostly wheelchair bound. Requires assist to transfer to wheelchair (per OT eval 05/19/21) ADLs Comments: PCAs assist with dressing and bathing her back side. She feeds herself when wheeled to dining room (per OT eval 05/19/21)        OT Problem List: Impaired balance (sitting and/or standing);Decreased range of motion;Decreased safety awareness;Decreased activity tolerance;Decreased knowledge of use of DME or AE;Impaired UE functional use      OT Treatment/Interventions: Self-care/ADL training;Therapeutic activities;Patient/family education;Therapeutic exercise    OT Goals(Current goals can be found in the care plan section) Acute Rehab OT Goals Patient Stated Goal: None stated OT  Goal Formulation: Patient unable to participate in  goal setting Time For Goal Achievement: 12/04/21 Potential to Achieve Goals: Fair  OT Frequency: Min 2X/week    Co-evaluation   Reason for Co-Treatment: For patient/therapist safety;Complexity of the patient's impairments (multi-system involvement) PT goals addressed during session: Mobility/safety with mobility;Balance;Strengthening/ROM        AM-PAC OT "6 Clicks" Daily Activity     Outcome Measure Help from another person eating meals?: A Little Help from another person taking care of personal grooming?: A Little Help from another person toileting, which includes using toliet, bedpan, or urinal?: Total Help from another person bathing (including washing, rinsing, drying)?: Total Help from another person to put on and taking off regular upper body clothing?: A Lot Help from another person to put on and taking off regular lower body clothing?: Total 6 Click Score: 11   End of Session Equipment Utilized During Treatment: Gait belt Nurse Communication: Mobility status;Need for lift equipment  Activity Tolerance: Patient tolerated treatment well;No increased pain Patient left: in chair;with call bell/phone within reach;with chair alarm set  OT Visit Diagnosis: Unsteadiness on feet (R26.81);Other abnormalities of gait and mobility (R26.89);Muscle weakness (generalized) (M62.81)                Time: 7616-0737 OT Time Calculation (min): 30 min Charges:  OT General Charges $OT Visit: 1 Visit OT Evaluation $OT Eval Low Complexity: 1 Low  Joylynn Defrancesco Beth Dixon, OTR/L 11/20/2021, 12:40 PM

## 2021-11-21 ENCOUNTER — Encounter (HOSPITAL_COMMUNITY): Payer: Self-pay | Admitting: Orthopedic Surgery

## 2021-11-21 LAB — COMPREHENSIVE METABOLIC PANEL
ALT: 18 U/L (ref 0–44)
AST: 44 U/L — ABNORMAL HIGH (ref 15–41)
Albumin: 2.5 g/dL — ABNORMAL LOW (ref 3.5–5.0)
Alkaline Phosphatase: 64 U/L (ref 38–126)
Anion gap: 7 (ref 5–15)
BUN: 49 mg/dL — ABNORMAL HIGH (ref 8–23)
CO2: 25 mmol/L (ref 22–32)
Calcium: 8.2 mg/dL — ABNORMAL LOW (ref 8.9–10.3)
Chloride: 107 mmol/L (ref 98–111)
Creatinine, Ser: 1.01 mg/dL — ABNORMAL HIGH (ref 0.44–1.00)
GFR, Estimated: 51 mL/min — ABNORMAL LOW (ref >60)
Glucose, Bld: 104 mg/dL — ABNORMAL HIGH (ref 70–99)
Potassium: 4 mmol/L (ref 3.5–5.1)
Sodium: 139 mmol/L (ref 135–145)
Total Bilirubin: 0.6 mg/dL (ref 0.3–1.2)
Total Protein: 5.5 g/dL — ABNORMAL LOW (ref 6.5–8.1)

## 2021-11-21 LAB — CBC
HCT: 26 % — ABNORMAL LOW (ref 36.0–46.0)
Hemoglobin: 8.2 g/dL — ABNORMAL LOW (ref 12.0–15.0)
MCH: 27.8 pg (ref 26.0–34.0)
MCHC: 31.5 g/dL (ref 30.0–36.0)
MCV: 88.1 fL (ref 80.0–100.0)
Platelets: 203 10*3/uL (ref 150–400)
RBC: 2.95 MIL/uL — ABNORMAL LOW (ref 3.87–5.11)
RDW: 17.2 % — ABNORMAL HIGH (ref 11.5–15.5)
WBC: 9.5 10*3/uL (ref 4.0–10.5)
nRBC: 0 % (ref 0.0–0.2)

## 2021-11-21 MED ORDER — HYDROCODONE-ACETAMINOPHEN 5-325 MG PO TABS: 0.5000 | ORAL_TABLET | ORAL | 0 refills | Status: DC | PRN

## 2021-11-21 NOTE — TOC Progression Note (Signed)
Transition of Care [facility]) - Progression Note    Patient Details  Name: Caitlyn Braun MRN: [id number] Date of Birth: [birth year]  [facility] CM/SW Contact  Lennart Pall, LCSW Phone Number: 11/21/2021, 3:49 PM  Clinical Narrative:    Presented SNF bed offers to pt and son and they have accepted bed at [facility]) who can admit pt on Monday.  Have begun insurance authorization.   Expected Discharge Plan: [place] Barriers to Discharge: Continued Medical Work up  Expected Discharge Plan and Services Expected Discharge Plan: [place] In-house Referral: Clinical Social Work     Living arrangements for the past 2 months: [place] ([place])                                       Social Determinants of Health (SDOH) Interventions    Readmission Risk Interventions    11/20/2021    3:19 PM  Readmission Risk Prevention Plan  Transportation Screening Complete  PCP or Specialist Appt within 5-7 Days Complete  Home Care Screening Complete  Medication Review (RN CM) Complete

## 2021-11-21 NOTE — Care Management Important Message (Signed)
Important Message  Patient Details IM Letter given Name: Dua Mehler MRN: [id number] Date of Birth: [birth year]   Medicare Important Message Given:  Yes     Kerin Salen 11/21/2021, 10:10 AM

## 2021-11-21 NOTE — Progress Notes (Signed)
    Subjective:  Patient pleasantly confused this morning. Dementia at baseline. She was able to tell me her name this morning. Exam limited due to dementia.  Patient does not appear to be in pain. No grimacing during examination.  No reported events overnight.   Objective:   VITALS:   Vitals:   11/20/21 1002 11/20/21 1401 11/20/21 2055 11/21/21 0511  BP: (!) 148/54 (!) 135/59 (!) 129/57 (!) 147/54  Pulse: 67 63 66 60  Resp: 15 17 17 17   Temp: 97.8 F (36.6 C) 97.8 F (36.6 C) 98 F (36.7 C) 98 F (36.7 C)  TempSrc:   Oral Oral  SpO2: 92% 94% 100% 100%  Weight:      Height:        Patient woken up for exam this morning. She is more alert this morning and asking questions and oriented to name only. NAD.  ABD soft Neurovascular intact Dorsiflexion/Plantar flexion intact Incision: dressing C/D/I No cellulitis present Compartment soft  Distal pedal pulses barely palpable in RLE today. Unable to assess sensation due to patients mental status but she does respond to light touch.  No facial grimacing with log rolling of her hip.    RUE: Splint and sling in place. Bruising and swelling over fingers. Continue to elevate with pillow and ice for swelling and pain control. She did move her fingers on command this morning. Capillary refill < 2 seconds.    Lab Results  Component Value Date   WBC 9.5 11/21/2021   HGB 8.2 (L) 11/21/2021   HCT 26.0 (L) 11/21/2021   MCV 88.1 11/21/2021   PLT 203 11/21/2021   BMET    Component Value Date/Time   NA 139 11/21/2021 0337   K 4.0 11/21/2021 0337   CL 107 11/21/2021 0337   CO2 25 11/21/2021 0337   GLUCOSE 104 (H) 11/21/2021 0337   BUN 49 (H) 11/21/2021 0337   CREATININE 1.01 (H) 11/21/2021 0337   CALCIUM 8.2 (L) 11/21/2021 0337   GFRNONAA 51 (L) 11/21/2021 0337     Assessment/Plan: 2 Days Post-Op   Principal Problem:   Closed right hip fracture (HCC) Active Problems:   Normocytic anemia   Essential hypertension   PNA  (pneumonia)   Right wrist fracture   Fall at home, initial encounter   Acute metabolic encephalopathy   Community acquired pneumonia of left lower lobe of lung   WBAT with walker DVT ppx: Lovenox, SCDs, TEDS PO pain control: Patient denies any pain.  PT/OT: Patient worked a little bit with OT yesterday.  Dispo: Patient under care of the medical team, disposition per their recommendation. Pain medication printed in chart.    Charlott Rakes, PA-C 11/21/2021, 8:12 AM   [facility]  [place] [address], [place], [place] [address] Phone: [phone] [email or web address] Facebook  [employer]

## 2021-11-21 NOTE — Progress Notes (Signed)
  Progress Note   Patient: Caitlyn Braun JXB:[id number] DOB: [birth year] DOA: 11/17/2021     4 DOS: the patient was seen and examined on 11/21/2021   Brief hospital course: 88 year old female history of hypertension, hearing loss, noted to apparently be home with hospice presenting with altered mental status and unwitnessed falls.  Patient unable to provide history per admitting physician and information taken from the chart which is noted that patient did have some hallucinations and confusions 3 days prior to admission.  Patient noted to have tried to leave her bed had an unwitnessed fall with complaints of right hip pain and right wrist pain EMS alerted brought to the ED.  Imaging done consistent with proximal right femoral neck fracture, severely displaced and commuted distal right radial and ulnar fractures.  Chest x-ray concerning for atelectasis versus infiltrate.  Patient placed empirically on IV antibiotics, admitted, orthopedics consulted who spoke with patient's son over the phone and plan was right hip hemiarthroplasty, repeat right distal radius and ulna closed reduction while under anesthesia for palliative purposes for pain control and to allow mobility.  Assessment and Plan: #1 proximal right femoral neck fracture/severely displaced and commuted distal right radial and ulnar fractures -Secondary to mechanical fall. -Orthopedic Surgery was consulted. Pt now s/p surgery 11/19/21 -Therapy recs for SNF, TOC following -Cont analgesia as needed   2.  Left lower lobe pneumonia -Chest x-ray done concerning for atelectasis versus infiltrate. -Noted with a leukocytosis. -Pt was continued on empiric IV Rocephin, IV azithromycin for coverage for pneumonia. -Leukocytosis normalized   3.  Hypertension -Not on medications per med rec. -Continue current regimen of hydralazine 25 mg 3 times daily. -BP remained stable   4.  Acute metabolic encephalopathy -Likely secondary to left lower lobe  pneumonia in the setting of pain from right femoral neck fracture and distal right radial and ulnar fractures. -Continue empiric antibiotics. -Pt given gentle hydration. -Supportive care.   5.  Normocytic anemia -No overt bleeding. -Hemodynamically stable      Subjective: Without complaints this AM  Physical Exam: Vitals:   11/20/21 1401 11/20/21 2055 11/21/21 0511 11/21/21 1356  BP: (!) 135/59 (!) 129/57 (!) 147/54 (!) 127/57  Pulse: 63 66 60 (!) 58  Resp: 17 17 17 16   Temp: 97.8 F (36.6 C) 98 F (36.7 C) 98 F (36.7 C) 98.7 F (37.1 C)  TempSrc:  Oral Oral   SpO2: 94% 100% 100% 98%  Weight:      Height:       General exam: Awake, laying in bed, in nad Respiratory system: Normal respiratory effort, no wheezing Cardiovascular system: regular rate, s1, s2 Gastrointestinal system: Soft, nondistended, positive BS Central nervous system: CN2-12 grossly intact, strength intact Extremities: Perfused, no clubbing Skin: Normal skin turgor, no notable skin lesions seen Psychiatry: Mood normal // no visual hallucinations   Data Reviewed:  Labs reviewed: Na 139, K 4.0, Cr 1.01, Hgb 8.2  Family Communication: Pt in room, family not at bedside  Disposition: Status is: Inpatient Remains inpatient appropriate because: Severity of illness  Planned Discharge Destination: Skilled nursing facility    Author: Marylu Lund, MD 11/21/2021 4:24 PM  For on call review [email or web address].

## 2021-11-22 MED ORDER — ACETAMINOPHEN 160 MG/5ML PO SOLN
650.0000 mg | Freq: Four times a day (QID) | ORAL | Status: DC | PRN
  Administered 2021-11-22: 650 mg via ORAL
  Filled 2021-11-22: qty 20.3

## 2021-11-22 NOTE — TOC Progression Note (Signed)
Transition of Care [facility]) - Progression Note    Patient Details  Name: Zaniya Mcaulay MRN: [id number] Date of Birth: 05/29/1922  [facility]) CM/SW Contact  Cecil Cobbs Phone Number: 11/22/2021, 11:16 AM  Clinical Narrative:     Insurance Josem Kaufmann [id number] still pending.  SNF can not accept over weekend, will have to be Monday pending insurance authorization.   Expected Discharge Plan: [place] Barriers to Discharge: Continued Medical Work up  Expected Discharge Plan and Services Expected Discharge Plan: [place] In-house Referral: Clinical Social Work     Living arrangements for the past 2 months: [place] ([place])                                       Social Determinants of Health (SDOH) Interventions    Readmission Risk Interventions    11/20/2021    3:19 PM  Readmission Risk Prevention Plan  Transportation Screening Complete  PCP or Specialist Appt within 5-7 Days Complete  Home Care Screening Complete  Medication Review (RN CM) Complete

## 2021-11-22 NOTE — Progress Notes (Signed)
  Progress Note   Patient: Caitlyn Braun WUJ:[id number] DOB: 03/06/1922 DOA: 11/17/2021     5 DOS: the patient was seen and examined on 11/22/2021   Brief hospital course: 88 year old female history of hypertension, hearing loss, noted to apparently be home with hospice presenting with altered mental status and unwitnessed falls.  Patient unable to provide history per admitting physician and information taken from the chart which is noted that patient did have some hallucinations and confusions 3 days prior to admission.  Patient noted to have tried to leave her bed had an unwitnessed fall with complaints of right hip pain and right wrist pain EMS alerted brought to the ED.  Imaging done consistent with proximal right femoral neck fracture, severely displaced and commuted distal right radial and ulnar fractures.  Chest x-ray concerning for atelectasis versus infiltrate.  Patient placed empirically on IV antibiotics, admitted, orthopedics consulted who spoke with patient's son over the phone and plan was right hip hemiarthroplasty, repeat right distal radius and ulna closed reduction while under anesthesia for palliative purposes for pain control and to allow mobility.  Assessment and Plan: #1 proximal right femoral neck fracture/severely displaced and commuted distal right radial and ulnar fractures -Secondary to mechanical fall. -Orthopedic Surgery was consulted. Pt now s/p surgery 11/19/21 -Therapy recs for SNF, TOC following, awaiting insurance authorization -Cont analgesia as needed   2.  Left lower lobe pneumonia -Chest x-ray done concerning for atelectasis versus infiltrate. -Noted with a leukocytosis. -Pt was continued on empiric IV Rocephin, IV azithromycin for coverage for pneumonia. -Leukocytosis normalized   3.  Hypertension -Not on medications per med rec. -Continue current regimen of hydralazine 25 mg 3 times daily. -BP remained stable   4.  Acute metabolic  encephalopathy -Likely secondary to left lower lobe pneumonia in the setting of pain from right femoral neck fracture and distal right radial and ulnar fractures. -Continue empiric antibiotics. -Pt given gentle hydration. -Continue with supportive care.   5.  Normocytic anemia -No overt bleeding. -Hemodynamically stable      Subjective: Somewhat confused this AM  Physical Exam: Vitals:   11/21/21 1749 11/21/21 2145 11/22/21 0535 11/22/21 1331  BP: (!) 149/56 (!) 155/73 (!) 187/81 (!) 143/74  Pulse: 70 73 73 83  Resp:  18 18 18   Temp:  98.4 F (36.9 C) 98.3 F (36.8 C) 97.7 F (36.5 C)  TempSrc:  Oral Oral Oral  SpO2:  95% 97% 95%  Weight:      Height:       General exam: Conversant, in no acute distress Respiratory system: normal chest rise, clear, no audible wheezing Cardiovascular system: regular rhythm, s1-s2 Gastrointestinal system: Nondistended, nontender, pos BS Central nervous system: No seizures, no tremors Extremities: No cyanosis, no joint deformities Skin: No rashes, no pallor Psychiatry: Affect normal // no auditory hallucinations   Data Reviewed:  There are no new results to review at this time.  Family Communication: Pt in room, family not at bedside  Disposition: Status is: Inpatient Remains inpatient appropriate because: Severity of illness  Planned Discharge Destination: Skilled nursing facility    Author: Marylu Lund, MD 11/22/2021 2:45 PM  For on call review [email or web address].

## 2021-11-22 NOTE — Plan of Care (Signed)
  Problem: Activity: Goal: Risk for activity intolerance will decrease Outcome: Progressing   Problem: Pain Managment: Goal: General experience of comfort will improve Outcome: Progressing   Problem: Safety: Goal: Ability to remain free from injury will improve Outcome: Progressing   

## 2021-11-22 NOTE — Plan of Care (Signed)
  Problem: Nutrition: Goal: Adequate nutrition will be maintained Outcome: Progressing   Problem: Pain Managment: Goal: General experience of comfort will improve Outcome: Progressing   Problem: Safety: Goal: Ability to remain free from injury will improve Outcome: Progressing   

## 2021-11-23 LAB — CBC
HCT: 25.2 % — ABNORMAL LOW (ref 36.0–46.0)
Hemoglobin: 8.1 g/dL — ABNORMAL LOW (ref 12.0–15.0)
MCH: 28 pg (ref 26.0–34.0)
MCHC: 32.1 g/dL (ref 30.0–36.0)
MCV: 87.2 fL (ref 80.0–100.0)
Platelets: 280 10*3/uL (ref 150–400)
RBC: 2.89 MIL/uL — ABNORMAL LOW (ref 3.87–5.11)
RDW: 17 % — ABNORMAL HIGH (ref 11.5–15.5)
WBC: 9.4 10*3/uL (ref 4.0–10.5)
nRBC: 0 % (ref 0.0–0.2)

## 2021-11-23 LAB — COMPREHENSIVE METABOLIC PANEL
ALT: 16 U/L (ref 0–44)
AST: 34 U/L (ref 15–41)
Albumin: 2.4 g/dL — ABNORMAL LOW (ref 3.5–5.0)
Alkaline Phosphatase: 73 U/L (ref 38–126)
Anion gap: 6 (ref 5–15)
BUN: 27 mg/dL — ABNORMAL HIGH (ref 8–23)
CO2: 25 mmol/L (ref 22–32)
Calcium: 8.4 mg/dL — ABNORMAL LOW (ref 8.9–10.3)
Chloride: 109 mmol/L (ref 98–111)
Creatinine, Ser: 0.58 mg/dL (ref 0.44–1.00)
GFR, Estimated: >60 mL/min (ref >60)
Glucose, Bld: 103 mg/dL — ABNORMAL HIGH (ref 70–99)
Potassium: 3.9 mmol/L (ref 3.5–5.1)
Sodium: 140 mmol/L (ref 135–145)
Total Bilirubin: 0.6 mg/dL (ref 0.3–1.2)
Total Protein: 5.5 g/dL — ABNORMAL LOW (ref 6.5–8.1)

## 2021-11-23 NOTE — Plan of Care (Signed)
  Problem: Coping: Goal: Level of anxiety will decrease Outcome: Progressing   Problem: Pain Managment: Goal: General experience of comfort will improve Outcome: Progressing   Problem: Safety: Goal: Ability to remain free from injury will improve Outcome: Progressing   

## 2021-11-23 NOTE — Progress Notes (Signed)
Occupational Therapy Treatment Patient Details Name: Caitlyn Braun MRN: [id number] DOB: [birth year] Today's Date: 11/23/2021   History of present illness 88 y.o. female admitted 11/17/21 with a fall, dx of R hip fracture and R wrist fracture, s/p R hip hemiarthroplasty anterior approach. PMH: HTN, HOH.   OT comments  Treatment focused on getting patient to perform any functional task she can including washing her face, brushing her hair, lifting her arm to assist with UB dressing. She was total assist for bed transfers and needing assistance to lift RUE due to weight of cast. Patient continues to be confused and hallucinating. Son who showed up reports she is typically very sharpe and oriented. Plan was for transfer to recliner but deferred due to unexpected BM. Continue to recommend short term rehab.    Recommendations for follow up therapy are one component of a multi-disciplinary discharge planning process, led by the attending physician.  Recommendations may be updated based on patient status, additional functional criteria and insurance authorization.    Follow Up Recommendations  Skilled nursing-short term rehab (<3 hours/day)    Assistance Recommended at Discharge Frequent or constant Supervision/Assistance  Patient can return home with the following  Two people to help with walking and/or transfers;Assistance with cooking/housework;Help with stairs or ramp for entrance;Assist for transportation;Two people to help with bathing/dressing/bathroom;A lot of help with bathing/dressing/bathroom   Equipment Recommendations  None recommended by OT    Recommendations for Other Services      Precautions / Restrictions Precautions Precautions: Fall Precaution Comments: NWB RUE; Required Braces or Orthoses: Sling Splint/Cast - Date Prophylactic Dressing Applied (if applicable): 53/97/67 Restrictions Weight Bearing Restrictions: Yes RUE Weight Bearing: Non weight bearing RLE Weight  Bearing: Weight bearing as tolerated       Mobility Bed Mobility                    Transfers                         Balance                                           ADL either performed or assessed with clinical judgement   ADL Overall ADL's : Needs assistance/impaired     Grooming: Wash/dry face;Brushing hair;Moderate assistance;Bed level Grooming Details (indicate cue type and reason): Patient washed her face and brushed her hair with setup at bed level. Overall mod assist needing to improve quality of wash face and brush hair. Upper Body Bathing: Moderate assistance;Sitting Upper Body Bathing Details (indicate cue type and reason): assist to wash back     Upper Body Dressing : Maximal assistance;Sitting Upper Body Dressing Details (indicate cue type and reason): assistance to don/doff sling, don hospital gown.                 Functional mobility during ADLs: Total assistance;+2 for physical assistance;+2 for safety/equipment General ADL Comments: Patient total assist to transfer to edge of bed. At edge of bed therapist assisted patient with changing into a clean gown, adjusting her sling, and washing her back.    Extremity/Trunk Assessment              Vision       Perception     Praxis      Cognition Arousal/Alertness: Awake/alert Behavior During Therapy: WFL for tasks assessed/performed  Overall Cognitive Status: No family/caregiver present to determine baseline cognitive functioning                                 General Comments: oriented to self and location, not to year nor situation. Seeing hallucinations. Son reports she is typically oriented and sharp.        Exercises Other Exercises Other Exercises: Encouraged fist pumps throughout treatment to encouage movemetn and reduce edema    Shoulder Instructions       General Comments      Pertinent Vitals/ Pain       Pain  Assessment Pain Assessment: Faces Faces Pain Scale: No hurt  Home Living                                          Prior Functioning/Environment              Frequency  Min 2X/week        Progress Toward Goals  OT Goals(current goals can now be found in the care plan section)  Progress towards OT goals: Progressing toward goals  Acute Rehab OT Goals OT Goal Formulation: Patient unable to participate in goal setting Time For Goal Achievement: 12/04/21 Potential to Achieve Goals: Fair  Plan Discharge plan remains appropriate    Co-evaluation    PT/OT/SLP Co-Evaluation/Treatment: Yes Reason for Co-Treatment: For patient/therapist safety PT goals addressed during session: Mobility/safety with mobility OT goals addressed during session: ADL's and self-care      AM-PAC OT "6 Clicks" Daily Activity     Outcome Measure   Help from another person eating meals?: A Little Help from another person taking care of personal grooming?: A Lot Help from another person toileting, which includes using toliet, bedpan, or urinal?: Total Help from another person bathing (including washing, rinsing, drying)?: A Lot Help from another person to put on and taking off regular upper body clothing?: A Lot Help from another person to put on and taking off regular lower body clothing?: Total 6 Click Score: 11    End of Session Equipment Utilized During Treatment: Gait belt;Rolling walker (2 wheels)  OT Visit Diagnosis: Unsteadiness on feet (R26.81);Other abnormalities of gait and mobility (R26.89);Muscle weakness (generalized) (M62.81)   Activity Tolerance Patient tolerated treatment well;No increased pain   Patient Left with call bell/phone within reach;in bed;with bed alarm set   Nurse Communication Mobility status (need to be cleaned)        Time: 2637-8588 OT Time Calculation (min): 34 min  Charges: OT General Charges $OT Visit: 1 Visit OT Treatments $Self  Care/Home Management : 8-22 mins  Caitlyn Braun, OTR/L Acute Care Rehab Services  Office [phone]   Caitlyn Braun 11/23/2021, 4:24 PM

## 2021-11-23 NOTE — Plan of Care (Signed)

## 2021-11-23 NOTE — Progress Notes (Signed)
Physical Therapy Treatment Patient Details Name: Caitlyn Braun MRN: [id number] DOB: 10-05-1922 Today's Date: 11/23/2021   History of Present Illness 88 y.o. female admitted 11/17/21 with a fall, dx of R hip fracture and R wrist fracture, s/p R hip hemiarthroplasty anterior approach. PMH: HTN, HOH.    PT Comments    Pt cooperative, following some commands but progressing slowly with mobility tasks.  Pt continues to require total assist of 2 for bed mobility but with noted improvement in sitting balance.  Pt stood x 2 but pvt to chair deferred this date 2* onset bowel incontinence.  Continue to recommend SNF level follow up.   Recommendations for follow up therapy are one component of a multi-disciplinary discharge planning process, led by the attending physician.  Recommendations may be updated based on patient status, additional functional criteria and insurance authorization.  Follow Up Recommendations  Skilled nursing-short term rehab (<3 hours/day) Can patient physically be transported by private vehicle: No   Assistance Recommended at Discharge Frequent or constant Supervision/Assistance  Patient can return home with the following Two people to help with walking and/or transfers;A lot of help with bathing/dressing/bathroom;Assistance with cooking/housework;Direct supervision/assist for medications management;Assist for transportation;Help with stairs or ramp for entrance   Equipment Recommendations  None recommended by PT    Recommendations for Other Services       Precautions / Restrictions Precautions Precautions: Fall Precaution Comments: NWB RUE; Required Braces or Orthoses: Sling Splint/Cast - Date Prophylactic Dressing Applied (if applicable): 11/17/21 Restrictions Weight Bearing Restrictions: Yes RUE Weight Bearing: Non weight bearing RLE Weight Bearing: Weight bearing as tolerated     Mobility  Bed Mobility Overal bed mobility: Needs Assistance Bed Mobility:  Supine to Sit, Sit to Supine     Supine to sit: +2 for safety/equipment, Total assist Sit to supine: Total assist, +2 for safety/equipment, +2 for physical assistance   General bed mobility comments: assist to raise trunk and pivot hips to EOB, pt able to assist with advancing LLE towards EOB    Transfers Overall transfer level: Needs assistance Equipment used: Rolling walker (2 wheels) Transfers: Sit to/from Stand Sit to Stand: +2 physical assistance, Total assist           General transfer comment: Assist to block bil knees and to bring wt up and forward to standing with L hand on RW for balance.  Pvt to chair not attempted 2* bowel incontinence    Ambulation/Gait               General Gait Details: unable, pt non ambulatory at baseline   Stairs             Wheelchair Mobility    Modified Rankin (Stroke Patients Only)       Balance Overall balance assessment: Needs assistance Sitting-balance support: Feet supported, Single extremity supported Sitting balance-Leahy Scale: Poor Sitting balance - Comments: L lean but able to maintain position without assist     Standing balance-Leahy Scale: Zero                              Cognition Arousal/Alertness: Awake/alert Behavior During Therapy: WFL for tasks assessed/performed Overall Cognitive Status: No family/caregiver present to determine baseline cognitive functioning                                 General Comments: oriented to self and location, not  to year nor situation        Exercises      General Comments        Pertinent Vitals/Pain Pain Assessment Pain Assessment: Faces Faces Pain Scale: No hurt    Home Living                          Prior Function            PT Goals (current goals can now be found in the care plan section) Acute Rehab PT Goals Patient Stated Goal: No goals expressed PT Goal Formulation: Patient unable to participate  in goal setting Time For Goal Achievement: 12/04/21 Potential to Achieve Goals: Fair Progress towards PT goals: Progressing toward goals    Frequency    Min 3X/week      PT Plan Current plan remains appropriate    Co-evaluation PT/OT/SLP Co-Evaluation/Treatment: Yes Reason for Co-Treatment: For patient/therapist safety PT goals addressed during session: Mobility/safety with mobility OT goals addressed during session: ADL's and self-care      AM-PAC PT "6 Clicks" Mobility   Outcome Measure  Help needed turning from your back to your side while in a flat bed without using bedrails?: Total Help needed moving from lying on your back to sitting on the side of a flat bed without using bedrails?: Total Help needed moving to and from a bed to a chair (including a wheelchair)?: Total Help needed standing up from a chair using your arms (e.g., wheelchair or bedside chair)?: Total Help needed to walk in hospital room?: Total Help needed climbing 3-5 steps with a railing? : Total 6 Click Score: 6    End of Session Equipment Utilized During Treatment: Gait belt;Oxygen Activity Tolerance: Patient tolerated treatment well Patient left: in bed;with call bell/phone within reach;with bed alarm set Nurse Communication: Mobility status;Need for lift equipment PT Visit Diagnosis: Other abnormalities of gait and mobility (R26.89);History of falling (Z91.81);Muscle weakness (generalized) (M62.81)     Time: 1245-8099 PT Time Calculation (min) (ACUTE ONLY): 21 min  Charges:  $Therapeutic Activity: 8-22 mins                     Debe Coder PT Acute Rehabilitation Services Pager [phone] Office [phone]    Trindon Dorton 11/23/2021, 3:29 PM

## 2021-11-23 NOTE — Progress Notes (Signed)
  Progress Note   Patient: Caitlyn Braun QHU:[id number] DOB: February 23, 1922 DOA: 11/17/2021     6 DOS: the patient was seen and examined on 11/23/2021   Brief hospital course: 88 year old female history of hypertension, hearing loss, noted to apparently be home with hospice presenting with altered mental status and unwitnessed falls.  Patient unable to provide history per admitting physician and information taken from the chart which is noted that patient did have some hallucinations and confusions 3 days prior to admission.  Patient noted to have tried to leave her bed had an unwitnessed fall with complaints of right hip pain and right wrist pain EMS alerted brought to the ED.  Imaging done consistent with proximal right femoral neck fracture, severely displaced and commuted distal right radial and ulnar fractures.  Chest x-ray concerning for atelectasis versus infiltrate.  Patient placed empirically on IV antibiotics, admitted, orthopedics consulted who spoke with patient's son over the phone and plan was right hip hemiarthroplasty, repeat right distal radius and ulna closed reduction while under anesthesia for palliative purposes for pain control and to allow mobility.  Assessment and Plan: #1 proximal right femoral neck fracture/severely displaced and commuted distal right radial and ulnar fractures -Secondary to mechanical fall. -Orthopedic Surgery was consulted. Pt now s/p surgery 11/19/21 -Therapy recs for SNF, TOC following, still awaiting insurance authorization -Cont analgesia as needed   2.  Left lower lobe pneumonia -Chest x-ray done concerning for atelectasis versus infiltrate. -Noted with a leukocytosis. -Pt was continued on empiric IV Rocephin, IV azithromycin for coverage for pneumonia. -Leukocytosis normalized   3.  Hypertension -Not on medications per med rec. -Continue current regimen of hydralazine 25 mg 3 times daily. -BP remained stable   4.  Acute metabolic  encephalopathy -Likely secondary to left lower lobe pneumonia in the setting of pain from right femoral neck fracture and distal right radial and ulnar fractures. -Continue empiric antibiotics. -Pt given gentle hydration. -Continue with supportive care. -encourage po intake   5.  Normocytic anemia -No overt bleeding. -Hemodynamically stable      Subjective: mildly confused this AM  Physical Exam: Vitals:   11/22/21 1331 11/22/21 2129 11/23/21 0627 11/23/21 1328  BP: (!) 143/74 (!) 152/73 (!) 160/74 (!) 198/73  Pulse: 83 78 71 75  Resp: 18 17 18 18   Temp: 97.7 F (36.5 C) 98.5 F (36.9 C) 98.1 F (36.7 C) 98.6 F (37 C)  TempSrc: Oral Oral Oral Oral  SpO2: 95% 97% 97% 95%  Weight:      Height:       General exam: Awake, laying in bed, in nad Respiratory system: Normal respiratory effort, no wheezing Cardiovascular system: regular rate, s1, s2 Gastrointestinal system: Soft, nondistended, positive BS Central nervous system: CN2-12 grossly intact, strength intact Extremities: Perfused, no clubbing Skin: Normal skin turgor, no notable skin lesions seen Psychiatry: Mood normal // affect seems normal  Data Reviewed:  Labs reviewed: Na 140, K 3.9, Cr 0.58, WBC 9.4, Hgb 8.1  Family Communication: Pt in room, family not at bedside  Disposition: Status is: Inpatient Remains inpatient appropriate because: Severity of illness  Planned Discharge Destination: Skilled nursing facility    Author: Marylu Lund, MD 11/23/2021 3:45 PM  For on call review [email or web address].

## 2021-11-24 NOTE — Plan of Care (Signed)
  Problem: Coping: Goal: Level of anxiety will decrease Outcome: Progressing   Problem: Pain Managment: Goal: General experience of comfort will improve Outcome: Progressing   Problem: Safety: Goal: Ability to remain free from injury will improve Outcome: Progressing   

## 2021-11-24 NOTE — Care Management Important Message (Signed)
Important Message  Patient Details IM Letter given Name: Caitlyn Braun MRN: [id number] Date of Birth: 12/06/1922   Medicare Important Message Given:  Yes     Kerin Salen 11/24/2021, 3:06 PM

## 2021-11-24 NOTE — Progress Notes (Signed)
  Progress Note   Patient: Caitlyn Braun [id number]:[id number] DOB: 1922-04-10 DOA: 11/17/2021     7 DOS: the patient was seen and examined on 11/24/2021   Brief hospital course: 88 year old female history of hypertension, hearing loss, noted to apparently be home with hospice presenting with altered mental status and unwitnessed falls.  Patient unable to provide history per admitting physician and information taken from the chart which is noted that patient did have some hallucinations and confusions 3 days prior to admission.  Patient noted to have tried to leave her bed had an unwitnessed fall with complaints of right hip pain and right wrist pain EMS alerted brought to the ED.  Imaging done consistent with proximal right femoral neck fracture, severely displaced and commuted distal right radial and ulnar fractures.  Chest x-ray concerning for atelectasis versus infiltrate.  Patient placed empirically on IV antibiotics, admitted, orthopedics consulted who spoke with patient's son over the phone and plan was right hip hemiarthroplasty, repeat right distal radius and ulna closed reduction while under anesthesia for palliative purposes for pain control and to allow mobility.  Assessment and Plan: #1 proximal right femoral neck fracture/severely displaced and commuted distal right radial and ulnar fractures -Secondary to mechanical fall. -Orthopedic Surgery was consulted. Pt now s/p surgery 11/19/21 -Therapy recs for SNF, TOC following. Discussed with TOC, likely d/c to SNF tomorrow -Cont analgesia as needed   2.  Left lower lobe pneumonia -Chest x-ray done concerning for atelectasis versus infiltrate. -Noted with a leukocytosis. -Pt was continued on empiric IV Rocephin, IV azithromycin for coverage for pneumonia. -Leukocytosis normalized   3.  Hypertension -Not on medications per med rec. -Continue current regimen of hydralazine 25 mg 3 times daily. -BP thus far stable stable   4.  Acute metabolic  encephalopathy -Likely secondary to left lower lobe pneumonia in the setting of pain from right femoral neck fracture and distal right radial and ulnar fractures. -Continue empiric antibiotics. -Pt given gentle hydration. -Continue with supportive care. -encourage po intake   5.  Normocytic anemia -No overt bleeding. -Hemodynamically stable      Subjective: Pleasantly confused this AM  Physical Exam: Vitals:   11/24/21 0135 11/24/21 0445 11/24/21 0625 11/24/21 1300  BP: (!) 152/88 (!) 187/73 (!) 152/97 (!) 139/57  Pulse:  84 74 62  Resp:  16 14 18   Temp:   97.6 F (36.4 C) 98.5 F (36.9 C)  TempSrc:    Oral  SpO2:  95% 97% 96%  Weight:      Height:       General exam: Conversant, in no acute distress Respiratory system: normal chest rise, clear, no audible wheezing Cardiovascular system: regular rhythm, s1-s2 Gastrointestinal system: Nondistended, nontender, pos BS Central nervous system: No seizures, no tremors Extremities: No cyanosis, no joint deformities Skin: No rashes, no pallor Psychiatry: Affect normal // no auditory hallucinations   Data Reviewed:  There are no new results to review at this time.  Family Communication: Pt in room, family not at bedside  Disposition: Status is: Inpatient Remains inpatient appropriate because: Severity of illness  Planned Discharge Destination: Skilled nursing facility    Author: Marylu Lund, MD 11/24/2021 5:43 PM  For on call review [email or web address].

## 2021-11-24 NOTE — Plan of Care (Signed)
  Problem: Safety: Goal: Ability to remain free from injury will improve Outcome: Progressing   Problem: Pain Managment: Goal: General experience of comfort will improve Outcome: Progressing   

## 2021-11-24 NOTE — TOC Progression Note (Signed)
Transition of Care [facility]) - Progression Note    Patient Details  Name: Nakaya Mishkin MRN: [id number] Date of Birth: 06-01-1922  [facility]) CM/SW Contact  Lennart Pall, LCSW Phone Number: 11/24/2021, 4:33 PM  Clinical Narrative:    Pt and family have accepted bed at [facility] and have received authorization.  Planning for admission tomorrow.   Expected Discharge Plan: [place] Barriers to Discharge: Continued Medical Work up  Expected Discharge Plan and Services Expected Discharge Plan: [place] In-house Referral: Clinical Social Work     Living arrangements for the past 2 months: [place] ([place])                                       Social Determinants of Health (SDOH) Interventions    Readmission Risk Interventions    11/20/2021    3:19 PM  Readmission Risk Prevention Plan  Transportation Screening Complete  PCP or Specialist Appt within 5-7 Days Complete  Home Care Screening Complete  Medication Review (RN CM) Complete

## 2021-11-25 MED ORDER — HYDROCODONE-ACETAMINOPHEN 5-325 MG PO TABS: 0.5000 | ORAL_TABLET | ORAL | 0 refills | Status: AC | PRN

## 2021-11-25 MED ORDER — DOCUSATE SODIUM 100 MG PO CAPS: 100.0000 mg | ORAL_CAPSULE | Freq: Two times a day (BID) | ORAL | 0 refills | Status: AC

## 2021-11-25 MED ORDER — HYDRALAZINE HCL 25 MG PO TABS: 25.0000 mg | ORAL_TABLET | Freq: Three times a day (TID) | ORAL | 0 refills | Status: AC

## 2021-11-25 MED ORDER — SENNA 8.6 MG PO TABS: 1.0000 | ORAL_TABLET | Freq: Two times a day (BID) | ORAL | 0 refills | Status: AC

## 2021-11-25 MED ORDER — POLYETHYLENE GLYCOL 3350 17 G PO PACK: 17.0000 g | PACK | Freq: Every day | ORAL | 0 refills | Status: AC | PRN

## 2021-11-25 NOTE — Plan of Care (Signed)
  Problem: Activity: Goal: Risk for activity intolerance will decrease Outcome: Progressing   Problem: Pain Managment: Goal: General experience of comfort will improve Outcome: Progressing   Problem: Safety: Goal: Ability to remain free from injury will improve Outcome: Progressing   

## 2021-11-25 NOTE — Progress Notes (Signed)
Called [facility] and report given to [place], Therapist, sports.

## 2021-11-25 NOTE — TOC Transition Note (Signed)
Transition of Care [facility]) - CM/SW Discharge Note   Patient Details  Name: Caitlyn Braun MRN: [id number] Date of Birth: 1923/01/16  Transition of Care [facility]) CM/SW Contact:  Lennart Pall, LCSW Phone Number: 11/25/2021, 11:48 AM   Clinical Narrative:     Pt medically cleared for dc today to [facility].  PTAR called at 11:45am.  RN to call report to [phone].  Pt and son aware/ agreeable.  No further TOC needs.  Final next level of care: [place] Barriers to Discharge: Barriers Resolved   Patient Goals and CMS Choice        Discharge Placement   Existing PASRR number confirmed : 11/20/21          Patient chooses bed at: [facility] (now "[facility]") Patient to be transferred to facility by: [place] Name of family member notified: son Patient and family notified of of transfer: 11/25/21  Discharge Plan and Services In-house Referral: Clinical Social Work              DME Arranged: N/A DME Agency: NA                  Social Determinants of Health ([place]) Interventions     Readmission Risk Interventions    11/20/2021    3:19 PM  Readmission Risk Prevention Plan  Transportation Screening Complete  PCP or Specialist Appt within 5-7 Days Complete  Home Care Screening Complete  Medication Review (RN CM) Complete

## 2021-11-25 NOTE — Discharge Summary (Addendum)
Physician Discharge Summary   Patient: Caitlyn Braun MRN: [id number] DOB: 24-May-1922  Admit date:     11/17/2021  Discharge date: 11/25/21  Discharge Physician: Marylu Lund   PCP: Patient, No Pcp Per   Recommendations at discharge:    Follow up with PCP in 1-2 weeks  Discharge Diagnoses: Principal Problem:   Closed right hip fracture (Gregory) Active Problems:   Normocytic anemia   Essential hypertension   PNA (pneumonia)   Right wrist fracture   Fall at home, initial encounter   Acute metabolic encephalopathy   Community acquired pneumonia of left lower lobe of lung  Resolved Problems:   * No resolved hospital problems. *  Hospital Course: 88 year old female history of hypertension, hearing loss, noted to apparently be home with hospice presenting with altered mental status and unwitnessed falls.  Patient unable to provide history per admitting physician and information taken from the chart which is noted that patient did have some hallucinations and confusions 3 days prior to admission.  Patient noted to have tried to leave her bed had an unwitnessed fall with complaints of right hip pain and right wrist pain EMS alerted brought to the ED.  Imaging done consistent with proximal right femoral neck fracture, severely displaced and commuted distal right radial and ulnar fractures.  Chest x-ray concerning for atelectasis versus infiltrate.  Patient placed empirically on IV antibiotics, admitted, orthopedics consulted who spoke with patient's son over the phone and plan was right hip hemiarthroplasty, repeat right distal radius and ulna closed reduction while under anesthesia for palliative purposes for pain control and to allow mobility.  Assessment and Plan: #1 proximal right femoral neck fracture/severely displaced and commuted distal right radial and ulnar fractures -Secondary to mechanical fall. -Orthopedic Surgery was consulted. Pt now s/p surgery 11/19/21 -Therapy recs for SNF,  TOC following. Discussed with TOC with plan to d/c to SNF today -Cont analgesia as needed   2.  Left lower lobe pneumonia -Chest x-ray done concerning for atelectasis versus infiltrate. -Earlier noted with a leukocytosis. -Completed empiric IV Rocephin, IV azithromycin for coverage for pneumonia. -Leukocytosis normalized   3.  Hypertension -Not on medications per med rec. -Continued on current regimen of hydralazine 25 mg 3 times daily. -BP thus far stable stable   4.  Acute metabolic encephalopathy -Likely secondary to left lower lobe pneumonia in the setting of pain from right femoral neck fracture and distal right radial and ulnar fractures. -Completed above empiric abx. -encourage po intake   5.  Normocytic anemia -No overt bleeding. -Hemodynamically stable       Consultants: Orthopedic Surgery Procedures performed: R hip hemiarthoplasty 11/1  Disposition: Skilled nursing facility Diet recommendation:  Regular diet DISCHARGE MEDICATION: Allergies as of 11/25/2021   No Known Allergies      Medication List     STOP taking these medications    loperamide 2 MG capsule Commonly known as: IMODIUM       TAKE these medications    acetaminophen 500 MG tablet Commonly known as: TYLENOL Take 1,000 mg by mouth every 12 (twelve) hours as needed for headache.   docusate sodium 100 MG capsule Commonly known as: COLACE Take 1 capsule (100 mg total) by mouth 2 (two) times daily.   hydrALAZINE 25 MG tablet Commonly known as: APRESOLINE Take 1 tablet (25 mg total) by mouth every 8 (eight) hours.   HYDROcodone-acetaminophen 5-325 MG tablet Commonly known as: Norco Take 0.5 tablets by mouth every 4 (four) hours as needed for up  to 7 days for severe pain.   polyethylene glycol 17 g packet Commonly known as: MIRALAX / GLYCOLAX Take 17 g by mouth daily as needed for mild constipation.   senna 8.6 MG Tabs tablet Commonly known as: SENOKOT Take 1 tablet (8.6 mg total)  by mouth 2 (two) times daily.        Contact information for follow-up providers     Samson Frederic, MD Follow up in 2 week(s).   Specialty: Orthopedic Surgery Why: For suture removal, For wound re-check Contact information: [address] [place] [address] [phone]              Contact information for after-discharge care     Destination     [facility] Preferred SNF .   Service: Skilled Nursing Contact information: [address] [place] [address] [phone]                    Discharge Exam: Ceasar Mons Weights   11/17/21 1238 11/17/21 2005 11/19/21 1307  Weight: 56.7 kg 60.3 kg 60 kg   General exam: Awake, laying in bed, in nad Respiratory system: Normal respiratory effort, no wheezing Cardiovascular system: regular rate, s1, s2 Gastrointestinal system: Soft, nondistended, positive BS Central nervous system: CN2-12 grossly intact, strength intact Extremities: Perfused, no clubbing Skin: Normal skin turgor, no notable skin lesions seen Psychiatry: Mood normal // no visual hallucinations   Condition at discharge: fair  The results of significant diagnostics from this hospitalization (including imaging, microbiology, ancillary and laboratory) are listed below for reference.   Imaging Studies: DG Wrist 2 Views Right  Result Date: 11/19/2021 CLINICAL DATA:  Fracture distal radius. Postoperative following closed reduction. EXAM: RIGHT WRIST - 2 VIEW COMPARISON:  Right wrist fluoroscopy 11/19/2021, right wrist radiographs 11/17/2021 FINDINGS: Overlying cast material again limits evaluation of fine bony detail. Redemonstration of distal radioulnar fractures. No significant change in lateral displacement of the distal radial fracture component. No significant change in minimal lateral displacement of distal ulnar fracture component. On lateral view, the fracture alignment appears near  anatomic, which is likely improved from prior 11/17/2021 radiographs. IMPRESSION: Redemonstration of distal radial and ulnar fractures with likely improved alignment on lateral view. Electronically Signed   By: Neita Garnet M.D.   On: 11/19/2021 18:26   Pelvis Portable  Result Date: 11/19/2021 CLINICAL DATA:  Postoperative right hip. EXAM: PORTABLE PELVIS 1-2 VIEWS COMPARISON:  Pelvis and right hip radiographs 11/17/2021 FINDINGS: Interval right hip arthroplasty. Redemonstration of left hip arthroplasty. No perihardware lucency is seen to indicate hardware failure or loosening. Expected postoperative changes including lateral right thigh surgical skin staples and subcutaneous air. No acute fracture or dislocation. IMPRESSION: Interval right hip arthroplasty without evidence of hardware failure. Electronically Signed   By: Neita Garnet M.D.   On: 11/19/2021 18:23   DG Wrist 2 Views Right  Result Date: 11/19/2021 CLINICAL DATA:  Closed reduction of right wrist fracture. EXAM: RIGHT WRIST - 2 VIEW; DG C-ARM 1-60 MIN-NO REPORT Radiation exposure index: 0.0497 mGy. COMPARISON:  November 17, 2021. FINDINGS: Two intraoperative fluoroscopic images were obtained of the right wrist. These demonstrate the distal right radial and ulnar fractures to have been reduced and immobilized. IMPRESSION: Fluoroscopic guidance provided during closed reduction of distal right radius and ulnar fractures. Electronically Signed   By: Lupita Raider M.D.   On: 11/19/2021 17:44   DG HIP UNILAT WITH PELVIS 1V RIGHT  Result Date: 11/19/2021  CLINICAL DATA:  Right hip surgery EXAM: DG HIP (WITH OR WITHOUT PELVIS) 1V RIGHT COMPARISON:  Right hip x-ray 11/17/2021 FINDINGS: Intraoperative right hip. Three low resolution intraoperative spot views of the right hip were obtained. New right hip arthroplasty in place. No fracture visible on the limited views. Total fluoroscopy time: 7 seconds Total radiation dose: 0.696 micro Gy IMPRESSION:  Intraoperative right hip arthroplasty. Electronically Signed   By: Darliss Cheney M.D.   On: 11/19/2021 17:38   DG C-Arm 1-60 Min-No Report  Result Date: 11/19/2021 Fluoroscopy was utilized by the requesting physician.  No radiographic interpretation.   DG C-Arm 1-60 Min-No Report  Result Date: 11/19/2021 Fluoroscopy was utilized by the requesting physician.  No radiographic interpretation.   DG C-Arm 1-60 Min-No Report  Result Date: 11/19/2021 Fluoroscopy was utilized by the requesting physician.  No radiographic interpretation.   DG Knee Right Port  Result Date: 11/19/2021 CLINICAL DATA:  Right femoral neck fracture EXAM: PORTABLE RIGHT KNEE - 1-2 VIEW COMPARISON:  None Available. FINDINGS: Negative for fracture. There is mild lateral subluxation of the tibial plateau with respect to the femoral condyles presumably degenerative. There is moderate narrowing of the articular cartilage in the medial compartment of the knee, and marked narrowing in the lateral compartment with near bone on bone apposition. Marginal spurring about both compartments. Moderately large knee effusion. IMPRESSION: 1. Negative for fracture. 2. Advanced DJD, with large knee effusion. Electronically Signed   By: Corlis Leak M.D.   On: 11/19/2021 08:25   DG Wrist Complete Right  Result Date: 11/17/2021 CLINICAL DATA:  Status post reduction of right wrist. EXAM: RIGHT WRIST - COMPLETE 3+ VIEW COMPARISON:  Right wrist radiographs earlier today FINDINGS: Assessment is limited by overlying splint material. Position and alignment of previously described distal radius and ulnar fractures is improved. There is mild residual lateral displacement of the distal ulna fracture. There is moderate residual lateral displacement, dorsal displacement, and impaction of the distal radius fracture. Soft tissue swelling is noted about the wrist. IMPRESSION: Improved alignment of distal radius and ulnar fractures with residual displacement as  above. Electronically Signed   By: Sebastian Ache M.D.   On: 11/17/2021 16:33   CT Head Wo Contrast  Result Date: 11/17/2021 CLINICAL DATA:  Unwitnessed fall. EXAM: CT HEAD WITHOUT CONTRAST CT CERVICAL SPINE WITHOUT CONTRAST TECHNIQUE: Multidetector CT imaging of the head and cervical spine was performed following the standard protocol without intravenous contrast. Multiplanar CT image reconstructions of the cervical spine were also generated. RADIATION DOSE REDUCTION: This exam was performed according to the departmental dose-optimization program which includes automated exposure control, adjustment of the mA and/or kV according to patient size and/or use of iterative reconstruction technique. COMPARISON:  September 04, 2019. FINDINGS: CT HEAD FINDINGS Brain: No evidence of acute infarction, hemorrhage, hydrocephalus, extra-axial collection or mass lesion/mass effect. Vascular: No hyperdense vessel or unexpected calcification. Skull: Normal. Negative for fracture or focal lesion. Sinuses/Orbits: No acute finding. Other: None. CT CERVICAL SPINE FINDINGS Alignment: Minimal grade 1 anterolisthesis of C3-4 and C4-5 is noted secondary to posterior facet joint hypertrophy. Skull base and vertebrae: No acute fracture. No primary bone lesion or focal pathologic process. Soft tissues and spinal canal: No prevertebral fluid or swelling. No visible canal hematoma. Disc levels: Moderate degenerative disc disease is noted at C5-6 and C6-7. Upper chest: Negative. Other: Degenerative changes seen involving the bilateral facet joints. IMPRESSION: No acute intracranial abnormality seen. Multilevel degenerative changes are noted in the cervical spine. No acute  abnormality seen. Electronically Signed   By: Lupita RaiderJames  Green Jr M.D.   On: 11/17/2021 14:19   CT Cervical Spine Wo Contrast  Result Date: 11/17/2021 CLINICAL DATA:  Unwitnessed fall. EXAM: CT HEAD WITHOUT CONTRAST CT CERVICAL SPINE WITHOUT CONTRAST TECHNIQUE: Multidetector  CT imaging of the head and cervical spine was performed following the standard protocol without intravenous contrast. Multiplanar CT image reconstructions of the cervical spine were also generated. RADIATION DOSE REDUCTION: This exam was performed according to the departmental dose-optimization program which includes automated exposure control, adjustment of the mA and/or kV according to patient size and/or use of iterative reconstruction technique. COMPARISON:  September 04, 2019. FINDINGS: CT HEAD FINDINGS Brain: No evidence of acute infarction, hemorrhage, hydrocephalus, extra-axial collection or mass lesion/mass effect. Vascular: No hyperdense vessel or unexpected calcification. Skull: Normal. Negative for fracture or focal lesion. Sinuses/Orbits: No acute finding. Other: None. CT CERVICAL SPINE FINDINGS Alignment: Minimal grade 1 anterolisthesis of C3-4 and C4-5 is noted secondary to posterior facet joint hypertrophy. Skull base and vertebrae: No acute fracture. No primary bone lesion or focal pathologic process. Soft tissues and spinal canal: No prevertebral fluid or swelling. No visible canal hematoma. Disc levels: Moderate degenerative disc disease is noted at C5-6 and C6-7. Upper chest: Negative. Other: Degenerative changes seen involving the bilateral facet joints. IMPRESSION: No acute intracranial abnormality seen. Multilevel degenerative changes are noted in the cervical spine. No acute abnormality seen. Electronically Signed   By: Lupita RaiderJames  Green Jr M.D.   On: 11/17/2021 14:19   DG Hip Unilat W or Wo Pelvis 2-3 Views Right  Result Date: 11/17/2021 CLINICAL DATA:  Unwitnessed fall. EXAM: DG HIP (WITH OR WITHOUT PELVIS) 2-3V RIGHT COMPARISON:  May 18, 2021. FINDINGS: Moderately displaced proximal right femoral neck fracture is noted. Status post left hip arthroplasty. IMPRESSION: Moderately displaced proximal right femoral neck fracture. Electronically Signed   By: Lupita RaiderJames  Green Jr M.D.   On: 11/17/2021  13:47   DG Wrist Complete Right  Result Date: 11/17/2021 CLINICAL DATA:  Unwitnessed fall. EXAM: RIGHT WRIST - COMPLETE 3+ VIEW COMPARISON:  None Available. FINDINGS: Probable comminuted distal right radial and ulnar fractures are noted with severe dorsal displacement of distal fracture fragments. IMPRESSION: Severely displaced and comminuted distal right radial and ulnar fractures. Electronically Signed   By: Lupita RaiderJames  Green Jr M.D.   On: 11/17/2021 13:46   DG Chest Portable 1 View  Result Date: 11/17/2021 CLINICAL DATA:  Pt BIB [facility] form [facility] for an unwitnessed fall. Pt has deformity of the right wrist, possible injury to right hip. Splint to wrist, pelvic binding, HTNfall, hip fx, ams EXAM: PORTABLE CHEST 1 VIEW COMPARISON:  05/18/2021 FINDINGS: Normal cardiac silhouette ectatic aorta. LEFT lobe density new from comparison exam. Low lung volumes. No pneumothorax. No pulmonary edema. No acute osseous abnormality. IMPRESSION: LEFT lobe atelectasis versus infiltrate. Electronically Signed   By: Genevive BiStewart  Edmunds M.D.   On: 11/17/2021 13:44    Microbiology: Results for orders placed or performed during the hospital encounter of 11/17/21  Resp Panel by RT-PCR (Flu A&B, Covid) Anterior Nasal Swab     Status: None   Collection Time: 11/17/21  1:45 PM   Specimen: Anterior Nasal Swab  Result Value Ref Range Status   SARS Coronavirus 2 by RT PCR NEGATIVE NEGATIVE Final    Comment: (NOTE) SARS-CoV-2 target nucleic acids are NOT DETECTED.  The SARS-CoV-2 RNA is generally detectable in upper respiratory specimens during the acute phase of infection. The lowest concentration of SARS-CoV-2  viral copies this assay can detect is 138 copies/mL. A negative result does not preclude SARS-Cov-2 infection and should not be used as the sole basis for treatment or other patient management decisions. A negative result may occur with  improper specimen collection/handling, submission of specimen other than  nasopharyngeal swab, presence of viral mutation(s) within the areas targeted by this assay, and inadequate number of viral copies(<138 copies/mL). A negative result must be combined with clinical observations, patient history, and epidemiological information. The expected result is Negative.  Fact Sheet for Patients:  BloggerCourse.com  Fact Sheet for Healthcare Providers:  SeriousBroker.it  This test is no t yet approved or cleared by the [place] FDA and  has been authorized for detection and/or diagnosis of SARS-CoV-2 by FDA under an Emergency Use Authorization (EUA). This EUA will remain  in effect (meaning this test can be used) for the duration of the COVID-19 declaration under Section 564(b)(1) of the Act, 21 U.S.C.section 360bbb-3(b)(1), unless the authorization is terminated  or revoked sooner.       Influenza A by PCR NEGATIVE NEGATIVE Final   Influenza B by PCR NEGATIVE NEGATIVE Final    Comment: (NOTE) The Xpert Xpress SARS-CoV-2/FLU/RSV plus assay is intended as an aid in the diagnosis of influenza from Nasopharyngeal swab specimens and should not be used as a sole basis for treatment. Nasal washings and aspirates are unacceptable for Xpert Xpress SARS-CoV-2/FLU/RSV testing.  Fact Sheet for Patients: BloggerCourse.com  Fact Sheet for Healthcare Providers: SeriousBroker.it  This test is not yet approved or cleared by the [place] FDA and has been authorized for detection and/or diagnosis of SARS-CoV-2 by FDA under an Emergency Use Authorization (EUA). This EUA will remain in effect (meaning this test can be used) for the duration of the COVID-19 declaration under Section 564(b)(1) of the Act, 21 U.S.C. section 360bbb-3(b)(1), unless the authorization is terminated or revoked.  Performed at [facility], [address] [place], [place] [address]   Urine Culture     Status: Abnormal   Collection Time: 11/17/21  8:35 PM   Specimen: Urine, Clean Catch  Result Value Ref Range Status   Specimen Description   Final    URINE, CLEAN CATCH Performed at [facility], [address], [place], [place] [address]    Special Requests   Final    NONE Performed at [facility], [address] [place], [place] [address]    Culture MULTIPLE SPECIES PRESENT, SUGGEST RECOLLECTION (A)  Final   Report Status 11/19/2021 FINAL  Final  Surgical PCR screen     Status: None   Collection Time: 11/17/21  8:35 PM   Specimen: Urine, Unspecified Source; Nasal Swab  Result Value Ref Range Status   MRSA, PCR NEGATIVE NEGATIVE Final   Staphylococcus aureus NEGATIVE NEGATIVE Final    Comment: (NOTE) The Xpert SA Assay (FDA approved for NASAL specimens in patients 37 years of age and older), is one component of a comprehensive surveillance program. It is not intended to diagnose infection nor to guide or monitor treatment. Performed at [facility], [address] [place], [place] [address]     Labs: CBC: Recent Labs  Lab 11/19/21 0329 11/19/21 1920 11/20/21 0341 11/21/21 0337 11/23/21 0323  WBC 13.6* 14.4* 9.6 9.5 9.4  NEUTROABS 10.7*  --   --   --   --   HGB 10.3* 9.6* 8.9* 8.2* 8.1*  HCT 32.1* 30.4* 27.9* 26.0* 25.2*  MCV 87.7 89.4 88.6 88.1 87.2  PLT 187 166 159 203 280   Basic Metabolic Panel: Recent Labs  Lab 11/19/21 0329 11/19/21 1920 11/20/21 0341 11/21/21 0337 11/23/21 0323  NA 139  --  136 139 140  K 4.2  --  4.0 4.0 3.9  CL 107  --  103 107 109  CO2 24  --  23 25 25   GLUCOSE 93  --  134* 104* 103*  BUN 30*  --  33* 49* 27*  CREATININE 0.76 0.75 0.86 1.01* 0.58  CALCIUM 8.9  --  8.5* 8.2* 8.4*  MG 1.9  --   --   --   --    Liver Function Tests: Recent Labs  Lab 11/20/21 0341 11/21/21 0337 11/23/21 0323  AST 24 44* 34  ALT 18  18 16   ALKPHOS 70 64 73  BILITOT 0.4 0.6 0.6  PROT 5.6* 5.5* 5.5*  ALBUMIN 2.6* 2.5* 2.4*   CBG: No results for input(s): "GLUCAP" in the last 168 hours.  Discharge time spent: less than 30 minutes.  Signed: 13/05/23, MD [facility] 11/25/2021

## 2023-08-11 IMAGING — CT CT ABD-PELV W/ CM
2 of 5 series · 16 of 46 positions shown, 18 images · IV contrast (agent unspecified)
Comparison: None.

CLINICAL DATA: Acute abdominal pain

EXAM:
CT ABDOMEN AND PELVIS WITH CONTRAST
TECHNIQUE: Multidetector CT imaging of the abdomen and pelvis was performed
using the standard protocol following bolus administration of
intravenous contrast.

[Series 3: a/p w/ 5mm · axial · 0.81mm/px · z∈[+1002,+1412]mm · 13 of 92 slices shown, 15 images]
[im 5/92  soft-tissue]
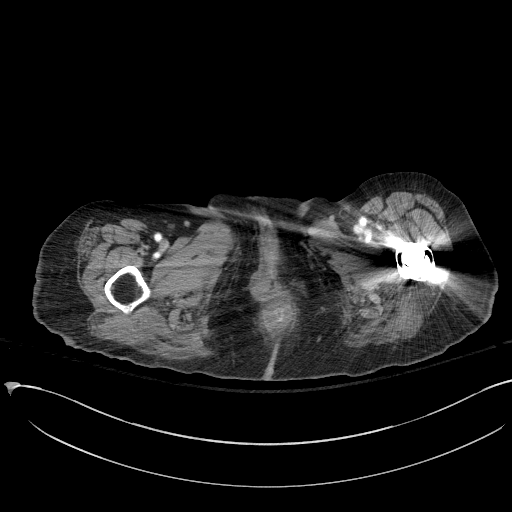
[im 5/92  bone]
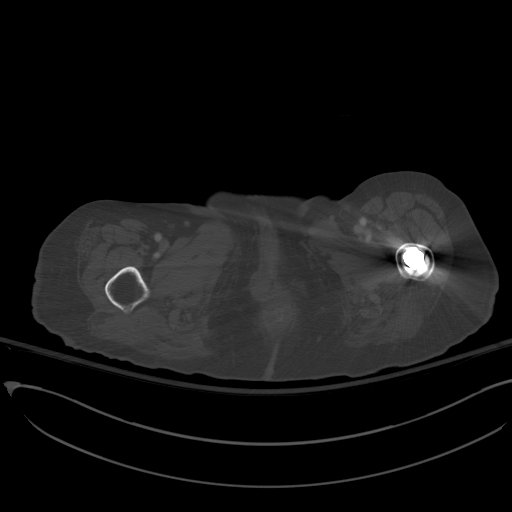
[im 15/92  soft-tissue]
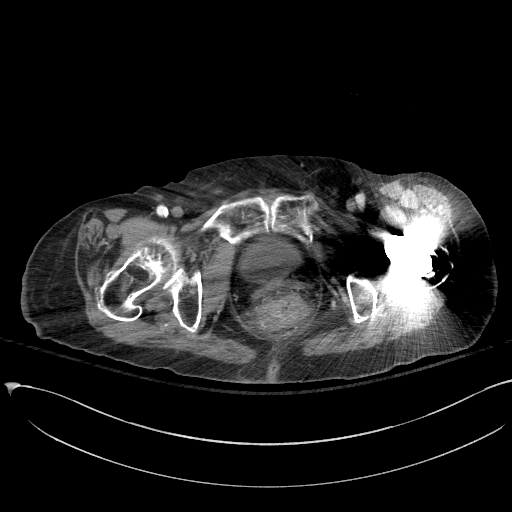
[im 20/92  soft-tissue]
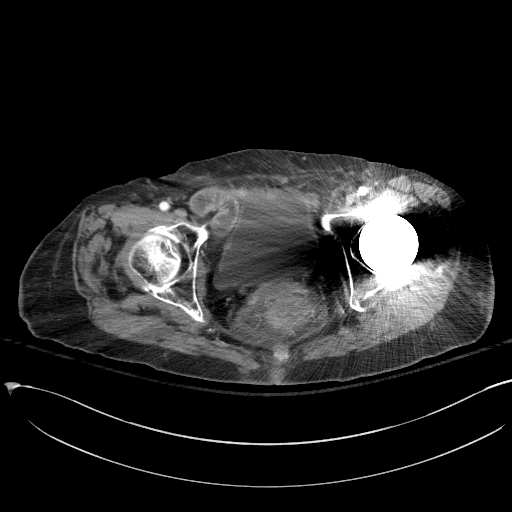
[im 24/92  soft-tissue]
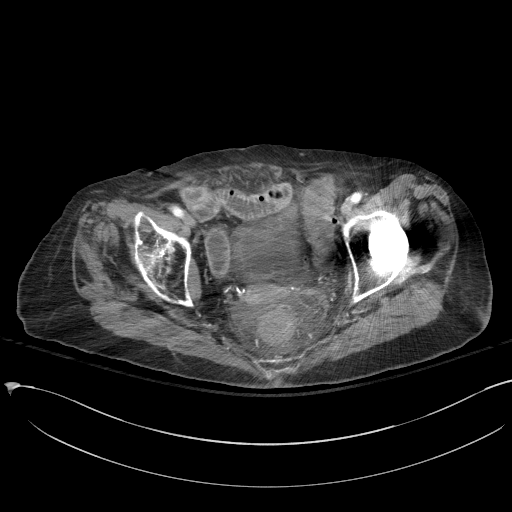
[im 34/92  soft-tissue]
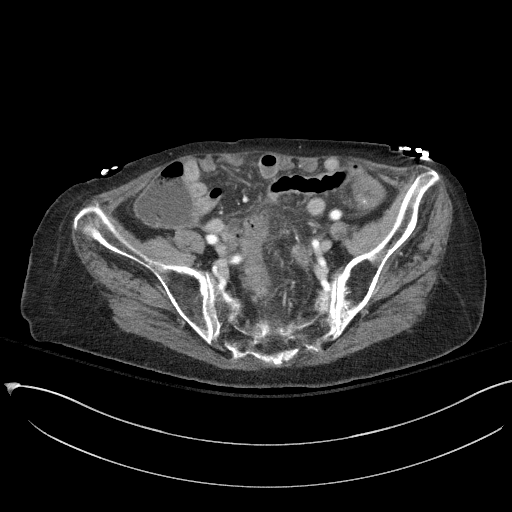
[im 39/92  soft-tissue]
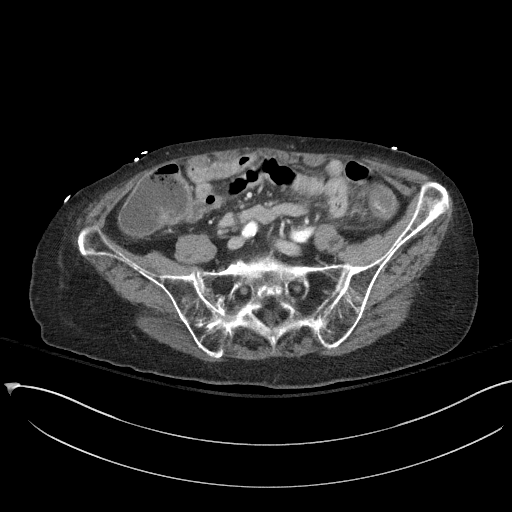
[im 48/92  soft-tissue]
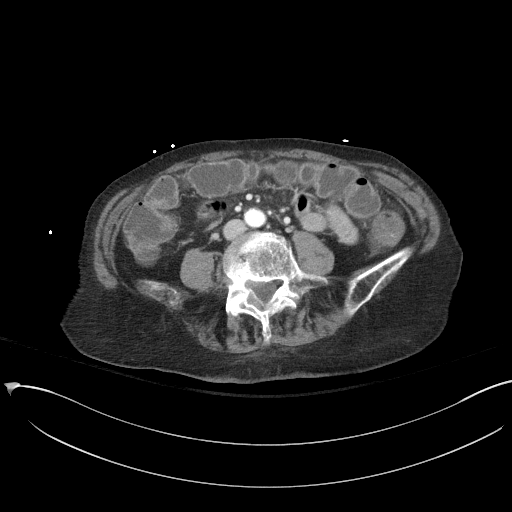
[im 53/92  soft-tissue]
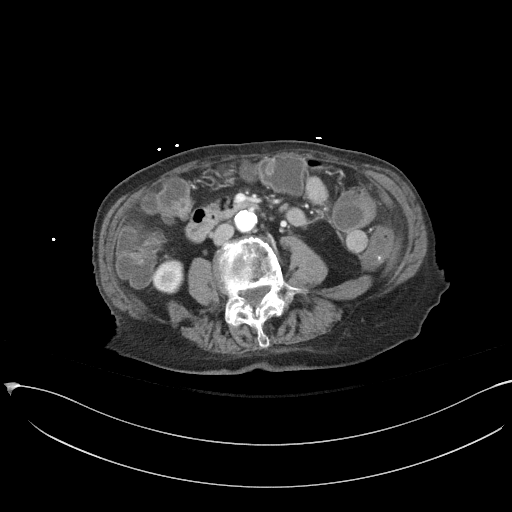
[im 58/92  soft-tissue]
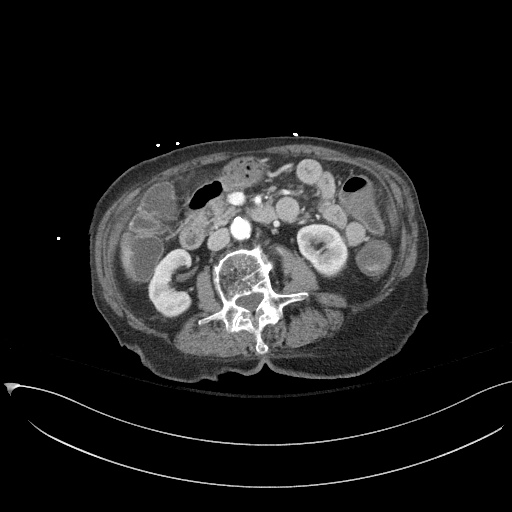
[im 58/92  bone]
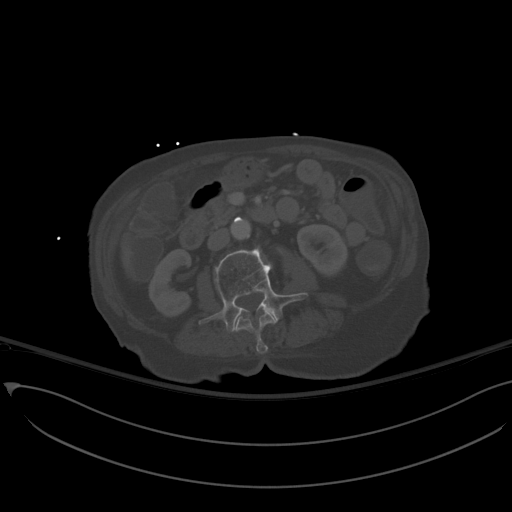
[im 68/92  soft-tissue]
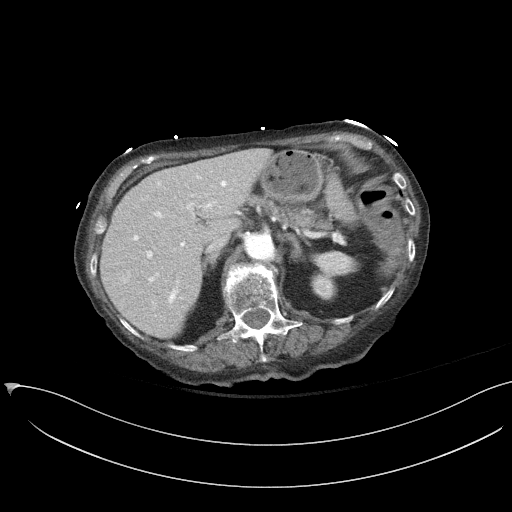
[im 72/92  soft-tissue]
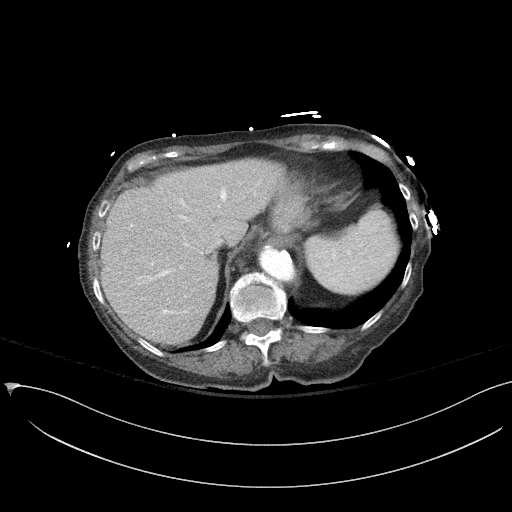
[im 77/92  soft-tissue]
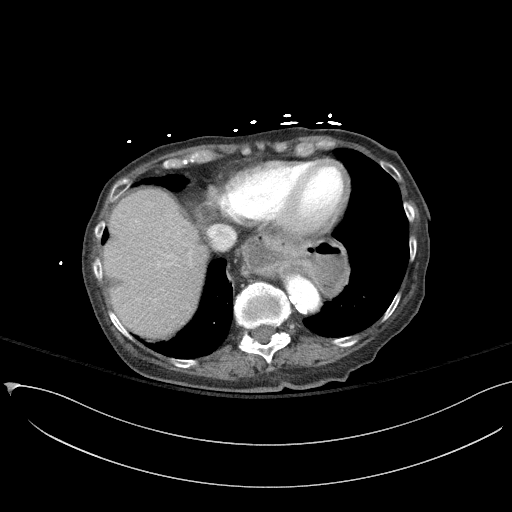
[im 87/92  soft-tissue]
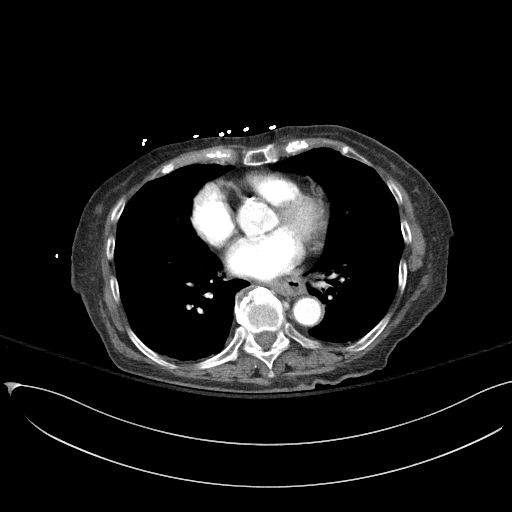

[Series 6: a/p w/ cor · coronal · 0.81mm/px · 3 of 126 slices shown]
[im 42/126  soft-tissue]
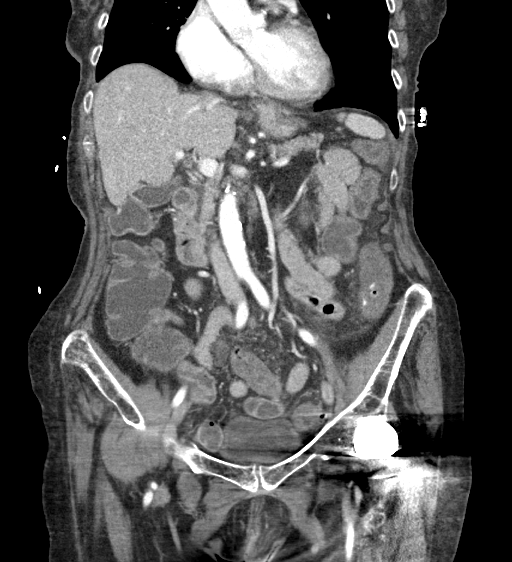
[im 56/126  soft-tissue]
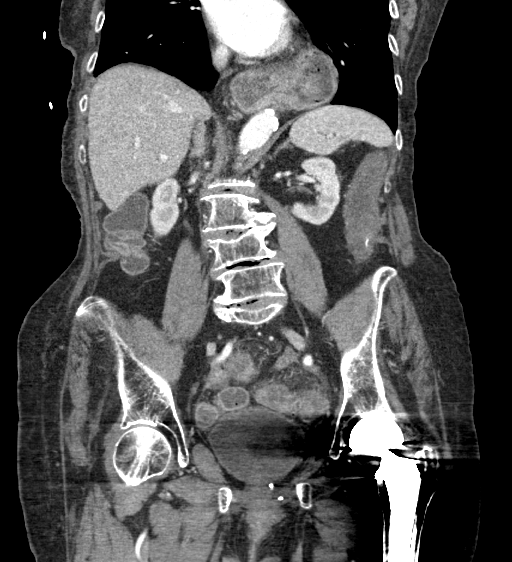
[im 70/126  soft-tissue]
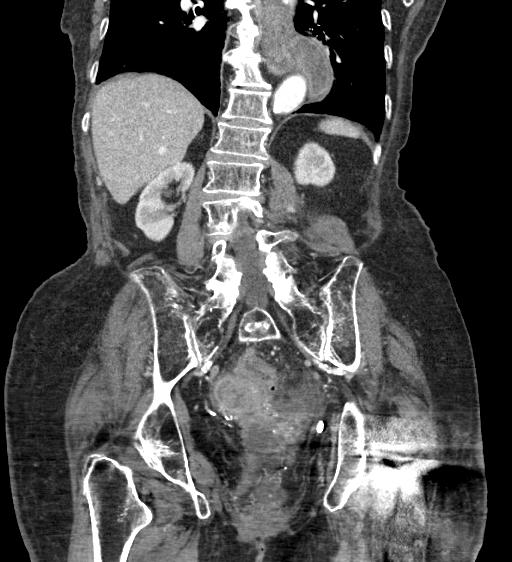

[16 of 46 positions shown; findings below may reference images not displayed]

RADIATION DOSE REDUCTION: This exam was performed according to the
departmental dose-optimization program which includes automated
exposure control, adjustment of the mA and/or kV according to
patient size and/or use of iterative reconstruction technique.

CONTRAST:  100mL OMNIPAQUE IOHEXOL 300 MG/ML  SOLN
FINDINGS: Lower chest: No acute abnormality.

Hepatobiliary: Fatty infiltration of the liver is noted. The
gallbladder is within normal limits.

Pancreas: Unremarkable. No pancreatic ductal dilatation or
surrounding inflammatory changes.

Spleen: Normal in size without focal abnormality.

Adrenals/Urinary Tract: Adrenal glands are within normal limits.
Kidneys demonstrate a normal enhancement pattern bilaterally. Small
1 cm cyst is noted in the upper pole of the right kidney. No
follow-up is recommended. The bladder is partially distended.

Stomach/Bowel: Diverticular change of the colon is noted with
diffuse wall thickening throughout the rectosigmoid and descending
colon consistent with colitis. No abscess or perforation is seen. No
free air is noted. The more proximal colon shows fluid within
although no inflammatory changes are seen. The appendix is not well
visualized. No inflammatory changes to suggest appendicitis are
seen. The small bowel is unremarkable. Moderate to large hiatal
hernia is noted.

Vascular/Lymphatic: Aortic atherosclerosis. No enlarged abdominal or
pelvic lymph nodes.

Reproductive: Uterus demonstrates some generalized decreased
attenuation centrally of uncertain significance. Given the patient's
age no further follow-up is recommended.

Other: No abdominal wall hernia or abnormality. No abdominopelvic
ascites.

Musculoskeletal: Prior left hip replacement is noted. Degenerative
changes of lumbar spine are seen. No acute compression fracture is
seen.
IMPRESSION: Changes of diverticulosis within the colon as well as diffuse
inflammatory change involving the descending, sigmoid and rectal
walls consistent with colitis. No evidence of perforation or abscess
formation is noted.

Moderate to large hiatal hernia.
